# Patient Record
Sex: Male | Born: 1975 | Race: Black or African American | Hispanic: No | Marital: Single | Smoking: Never smoker
Health system: Southern US, Community
[De-identification: ages and names within clinical notes are randomized; demographics above are authoritative.]

---

## 1999-01-30 ENCOUNTER — Encounter: Payer: Self-pay | Admitting: Emergency Medicine

## 1999-01-30 ENCOUNTER — Emergency Department (HOSPITAL_COMMUNITY): Admission: EM | Admit: 1999-01-30 | Discharge: 1999-01-30 | Payer: Self-pay | Admitting: Emergency Medicine

## 1999-03-29 ENCOUNTER — Inpatient Hospital Stay (HOSPITAL_COMMUNITY): Admission: EM | Admit: 1999-03-29 | Discharge: 1999-04-04 | Payer: Self-pay | Admitting: Emergency Medicine

## 1999-03-29 ENCOUNTER — Encounter: Payer: Self-pay | Admitting: Emergency Medicine

## 1999-03-30 ENCOUNTER — Encounter: Payer: Self-pay | Admitting: Surgery

## 1999-04-01 ENCOUNTER — Encounter: Payer: Self-pay | Admitting: Gastroenterology

## 1999-04-08 ENCOUNTER — Inpatient Hospital Stay (HOSPITAL_COMMUNITY): Admission: EM | Admit: 1999-04-08 | Discharge: 1999-04-13 | Payer: Self-pay | Admitting: Emergency Medicine

## 2001-04-14 ENCOUNTER — Encounter: Payer: Self-pay | Admitting: Emergency Medicine

## 2001-04-14 ENCOUNTER — Emergency Department (HOSPITAL_COMMUNITY): Admission: EM | Admit: 2001-04-14 | Discharge: 2001-04-14 | Payer: Self-pay | Admitting: Emergency Medicine

## 2002-01-05 ENCOUNTER — Emergency Department (HOSPITAL_COMMUNITY): Admission: EM | Admit: 2002-01-05 | Discharge: 2002-01-05 | Payer: Self-pay | Admitting: Emergency Medicine

## 2002-05-15 ENCOUNTER — Emergency Department (HOSPITAL_COMMUNITY): Admission: EM | Admit: 2002-05-15 | Discharge: 2002-05-16 | Payer: Self-pay | Admitting: Emergency Medicine

## 2002-05-15 ENCOUNTER — Encounter: Payer: Self-pay | Admitting: Emergency Medicine

## 2002-05-16 ENCOUNTER — Encounter: Payer: Self-pay | Admitting: Emergency Medicine

## 2002-05-17 ENCOUNTER — Encounter
Admission: RE | Admit: 2002-05-17 | Discharge: 2002-05-17 | Payer: Self-pay | Admitting: Thoracic Surgery (Cardiothoracic Vascular Surgery)

## 2002-05-17 ENCOUNTER — Encounter: Payer: Self-pay | Admitting: Thoracic Surgery (Cardiothoracic Vascular Surgery)

## 2002-05-23 ENCOUNTER — Encounter
Admission: RE | Admit: 2002-05-23 | Discharge: 2002-05-23 | Payer: Self-pay | Admitting: Thoracic Surgery (Cardiothoracic Vascular Surgery)

## 2002-05-23 ENCOUNTER — Encounter: Payer: Self-pay | Admitting: Thoracic Surgery (Cardiothoracic Vascular Surgery)

## 2003-04-28 ENCOUNTER — Emergency Department (HOSPITAL_COMMUNITY): Admission: EM | Admit: 2003-04-28 | Discharge: 2003-04-28 | Payer: Self-pay | Admitting: Emergency Medicine

## 2003-06-07 ENCOUNTER — Emergency Department (HOSPITAL_COMMUNITY): Admission: EM | Admit: 2003-06-07 | Discharge: 2003-06-07 | Payer: Self-pay | Admitting: Emergency Medicine

## 2004-05-09 ENCOUNTER — Emergency Department (HOSPITAL_COMMUNITY): Admission: EM | Admit: 2004-05-09 | Discharge: 2004-05-09 | Payer: Self-pay | Admitting: Emergency Medicine

## 2005-01-14 ENCOUNTER — Emergency Department (HOSPITAL_COMMUNITY): Admission: EM | Admit: 2005-01-14 | Discharge: 2005-01-14 | Payer: Self-pay | Admitting: *Deleted

## 2005-11-01 ENCOUNTER — Emergency Department (HOSPITAL_COMMUNITY): Admission: EM | Admit: 2005-11-01 | Discharge: 2005-11-01 | Payer: Self-pay | Admitting: Emergency Medicine

## 2006-07-22 ENCOUNTER — Emergency Department (HOSPITAL_COMMUNITY): Admission: EM | Admit: 2006-07-22 | Discharge: 2006-07-22 | Payer: Self-pay | Admitting: *Deleted

## 2007-05-09 ENCOUNTER — Emergency Department (HOSPITAL_COMMUNITY): Admission: EM | Admit: 2007-05-09 | Discharge: 2007-05-09 | Payer: Self-pay | Admitting: Emergency Medicine

## 2007-06-11 ENCOUNTER — Emergency Department (HOSPITAL_COMMUNITY): Admission: EM | Admit: 2007-06-11 | Discharge: 2007-06-11 | Payer: Self-pay | Admitting: Emergency Medicine

## 2007-06-15 ENCOUNTER — Emergency Department (HOSPITAL_COMMUNITY): Admission: EM | Admit: 2007-06-15 | Discharge: 2007-06-15 | Payer: Self-pay | Admitting: Emergency Medicine

## 2007-07-14 ENCOUNTER — Emergency Department (HOSPITAL_COMMUNITY): Admission: EM | Admit: 2007-07-14 | Discharge: 2007-07-14 | Payer: Self-pay | Admitting: Emergency Medicine

## 2008-05-08 ENCOUNTER — Ambulatory Visit: Payer: Self-pay | Admitting: Cardiology

## 2008-05-08 ENCOUNTER — Emergency Department (HOSPITAL_COMMUNITY): Admission: EM | Admit: 2008-05-08 | Discharge: 2008-05-08 | Payer: Self-pay | Admitting: Emergency Medicine

## 2008-05-09 ENCOUNTER — Encounter: Payer: Self-pay | Admitting: Cardiology

## 2008-05-09 ENCOUNTER — Ambulatory Visit: Payer: Self-pay
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
## 2011-09-17 LAB — CBC
HCT: 46.6
MCHC: 34.1
MCV: 92.5
Platelets: 203
RDW: 13.1

## 2011-09-17 LAB — RAPID URINE DRUG SCREEN, HOSP PERFORMED
Barbiturates: NOT DETECTED
Benzodiazepines: NOT DETECTED
Cocaine: POSITIVE — AB
Opiates: NOT DETECTED

## 2011-09-17 LAB — POCT CARDIAC MARKERS: Myoglobin, poc: 59.9

## 2011-09-17 LAB — POCT I-STAT, CHEM 8
Creatinine, Ser: 1.5
HCT: 50
Hemoglobin: 17
Potassium: 4.5
Sodium: 138
TCO2: 27

## 2011-09-17 LAB — DIFFERENTIAL
Basophils Relative: 1
Eosinophils Absolute: 0.3
Eosinophils Relative: 6 — ABNORMAL HIGH
Lymphs Abs: 1.3
Neutrophils Relative %: 53

## 2011-09-17 LAB — TSH: TSH: 1.62

## 2011-09-17 LAB — URINALYSIS, ROUTINE W REFLEX MICROSCOPIC
Glucose, UA: NEGATIVE
Ketones, ur: NEGATIVE
Nitrite: NEGATIVE
Specific Gravity, Urine: 1.012
pH: 7

## 2013-09-22 ENCOUNTER — Encounter (HOSPITAL_COMMUNITY): Payer: Self-pay | Admitting: Emergency Medicine

## 2013-09-22 ENCOUNTER — Emergency Department (HOSPITAL_COMMUNITY)
Admission: EM | Admit: 2013-09-22 | Discharge: 2013-09-22 | Disposition: A | Discharge status: Home / Self Care | Payer: Self-pay | Attending: Emergency Medicine | Admitting: Emergency Medicine

## 2013-09-22 DIAGNOSIS — H5710 Ocular pain, unspecified eye: Secondary | ICD-10-CM | POA: Insufficient documentation

## 2013-09-22 DIAGNOSIS — H5789 Other specified disorders of eye and adnexa: Secondary | ICD-10-CM | POA: Insufficient documentation

## 2013-09-22 DIAGNOSIS — Z79899 Other long term (current) drug therapy: Secondary | ICD-10-CM | POA: Insufficient documentation

## 2013-09-22 DIAGNOSIS — H579 Unspecified disorder of eye and adnexa: Secondary | ICD-10-CM | POA: Insufficient documentation

## 2013-09-22 DIAGNOSIS — Z7982 Long term (current) use of aspirin: Secondary | ICD-10-CM | POA: Insufficient documentation

## 2013-09-22 MED ORDER — FLUORESCEIN SODIUM 1 MG OP STRP
1.0000 | ORAL_STRIP | Freq: Once | OPHTHALMIC | Status: DC
  Filled 2013-09-22: qty 1

## 2013-09-22 MED ORDER — KETOROLAC TROMETHAMINE 0.5 % OP SOLN
1.0000 [drp] | Freq: Four times a day (QID) | OPHTHALMIC | Status: DC
  Administered 2013-09-22: 1 [drp] via OPHTHALMIC
  Filled 2013-09-22: qty 3

## 2013-09-22 MED ORDER — TETRACAINE HCL 0.5 % OP SOLN
1.0000 [drp] | Freq: Once | OPHTHALMIC | Status: DC
  Filled 2013-09-22: qty 2

## 2013-09-22 MED ORDER — POLYMYXIN B-TRIMETHOPRIM 10000-0.1 UNIT/ML-% OP SOLN
1.0000 [drp] | OPHTHALMIC | Status: DC
  Administered 2013-09-22: 1 [drp] via OPHTHALMIC
  Filled 2013-09-22: qty 10
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
## 2013-09-22 NOTE — ED Provider Notes (Signed)
Medical screening examination/treatment/procedure(s) were performed by non-physician practitioner and as supervising physician I was immediately available for consultation/collaboration.   William Bailley Guilford, MD 09/22/13 0909 

## 2013-09-22 NOTE — ED Notes (Signed)
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  Pt reports redness, and itching to eyes since then.

## 2013-09-22 NOTE — Progress Notes (Signed)
[facility] did not get to see patient but will be sending him information about the [facility] Orange Card program, using the address provided.  °

## 2013-09-22 NOTE — ED Notes (Signed)
Fluorescein and tetracaine at bedside for PA

## 2014-02-04 ENCOUNTER — Encounter (HOSPITAL_COMMUNITY): Payer: Self-pay | Admitting: Emergency Medicine

## 2014-02-04 ENCOUNTER — Emergency Department (HOSPITAL_COMMUNITY)
Admission: EM | Admit: 2014-02-04 | Discharge: 2014-02-04 | Disposition: A | Discharge status: Home / Self Care | Payer: Self-pay | Attending: Emergency Medicine | Admitting: Emergency Medicine

## 2014-02-04 DIAGNOSIS — Z7982 Long term (current) use of aspirin: Secondary | ICD-10-CM | POA: Insufficient documentation

## 2014-02-04 DIAGNOSIS — H109 Unspecified conjunctivitis: Secondary | ICD-10-CM | POA: Insufficient documentation

## 2014-02-04 DIAGNOSIS — Z79899 Other long term (current) drug therapy: Secondary | ICD-10-CM | POA: Insufficient documentation

## 2014-02-04 MED ORDER — HYDROCODONE-ACETAMINOPHEN 5-325 MG PO TABS: 1.0000 | ORAL_TABLET | ORAL | Status: DC | PRN

## 2014-02-04 MED ORDER — TOBRAMYCIN-DEXAMETHASONE 0.3-0.1 % OP SUSP
1.0000 [drp] | Freq: Four times a day (QID) | OPHTHALMIC | Status: DC
  Administered 2014-02-04: 1 [drp] via OPHTHALMIC
  Filled 2014-02-04: qty 2.5

## 2014-02-04 MED ORDER — FLUORESCEIN SODIUM 1 MG OP STRP
2.0000 | ORAL_STRIP | Freq: Once | OPHTHALMIC | Status: AC
  Administered 2014-02-04: 2 via OPHTHALMIC

## 2014-02-04 MED ORDER — PROPARACAINE HCL 0.5 % OP SOLN
2.0000 [drp] | Freq: Once | OPHTHALMIC | Status: AC
  Administered 2014-02-04: 2 [drp] via OPHTHALMIC
  Filled 2014-02-04: qty 15

## 2014-02-04 NOTE — ED Notes (Signed)
Pharmacy contacts to send tobradex to ED tube station.

## 2014-02-04 NOTE — Discharge Instructions (Signed)
Conjunctivitis °Conjunctivitis is commonly called "pink eye." Conjunctivitis can be caused by bacterial or viral infection, allergies, or injuries. There is usually redness of the lining of the eye, itching, discomfort, and sometimes discharge. There may be deposits of matter along the eyelids. A viral infection usually causes a watery discharge, while a bacterial infection causes a yellowish, thick discharge. Pink eye is very contagious and spreads by direct contact. °You may be given antibiotic eyedrops as part of your treatment. Before using your eye medicine, remove all drainage from the eye by washing gently with warm water and cotton balls. Continue to use the medication until you have awakened 2 mornings in a row without discharge from the eye. Do not rub your eye. This increases the irritation and helps spread infection. Use separate towels from other household members. Wash your hands with soap and water before and after touching your eyes. Use cold compresses to reduce pain and sunglasses to relieve irritation from light. Do not wear contact lenses or wear eye makeup until the infection is gone. °SEEK MEDICAL CARE IF:  °· Your symptoms are not better after 3 days of treatment. °· You have increased pain or trouble seeing. °· The outer eyelids become very red or swollen. °Document Released: 01/15/2005 Document Revised: 03/01/2012 Document Reviewed: 12/08/2005 °ExitCare® Patient Information ©2014 [employer]. ° °Allergic Conjunctivitis °The conjunctiva is a thin membrane that covers the visible white part of the eyeball and the underside of the eyelids. This membrane protects and lubricates the eye. The membrane has small blood vessels running through it that can normally be seen. When the conjunctiva becomes inflamed, the condition is called conjunctivitis. In response to the inflammation, the conjunctival blood vessels become swollen. The swelling results in redness in the normally white part of the  eye. °The blood vessels of this membrane also react when a person has allergies and is then called allergic conjunctivitis. This condition usually lasts for as long as the allergy persists. Allergic conjunctivitis cannot be passed to another person (non-contagious). The likelihood of bacterial infection is great and the cause is not likely due to allergies if the inflamed eye has: °· A sticky discharge. °· Discharge or sticking together of the lids in the morning. °· Scaling or flaking of the eyelids where the eyelashes come out. °· Red swollen eyelids. °CAUSES  °· Viruses. °· Irritants such as foreign bodies. °· Chemicals. °· General allergic reactions. °· Inflammation or serious diseases in the inside or the outside of the eye or the orbit (the boney cavity in which the eye sits) can cause a "red eye." °SYMPTOMS  °· Eye redness. °· Tearing. °· Itchy eyes. °· Burning feeling in the eyes. °· Clear drainage from the eye. °· Allergic reaction due to pollens or ragweed sensitivity. Seasonal allergic conjunctivitis is frequent in the spring when pollens are in the air and in the fall. °DIAGNOSIS  °This condition, in its many forms, is usually diagnosed based on the history and an ophthalmological exam. It usually involves both eyes. If your eyes react at the same time every year, allergies may be the cause. While most "red eyes" are due to allergy or an infection, the role of an eye (ophthalmological) exam is important. The exam can rule out serious diseases of the eye or orbit. °TREATMENT  °· Non-antibiotic eye drops, ointments, or medications by mouth may be prescribed if the ophthalmologist is sure the conjunctivitis is due to allergies alone. °· Over-the-counter drops and ointments for allergic symptoms should   be used only after other causes of conjunctivitis have been ruled out, or as your caregiver suggests. °Medications by mouth are often prescribed if other allergy-related symptoms are present. If the  ophthalmologist is sure that the conjunctivitis is due to allergies alone, treatment is normally limited to drops or ointments to reduce itching and burning. °HOME CARE INSTRUCTIONS  °· Wash hands before and after applying drops or ointments, or touching the inflamed eye(s) or eyelids. °· Do not let the eye dropper tip or ointment tube touch the eyelid when putting medicine in your eye. °· Stop using your soft contact lenses and throw them away. Use a new pair of lenses when recovery is complete. You should run through sterilizing cycles at least three times before use after complete recovery if the old soft contact lenses are to be used. Hard contact lenses should be stopped. They need to be thoroughly sterilized before use after recovery. °· Itching and burning eyes due to allergies is often relieved by using a cool cloth applied to closed eye(s). °SEEK MEDICAL CARE IF:  °· Your problems do not go away after two or three days of treatment. °· Your lids are sticky (especially in the morning when you wake up) or stick together. °· Discharge develops. Antibiotics may be needed either as drops, ointment, or by mouth. °· You have extreme light sensitivity. °· An oral temperature above 102° F (38.9° C) develops. °· Pain in or around the eye or any other visual symptom develops. °MAKE SURE YOU:  °· Understand these instructions. °· Will watch your condition. °· Will get help right away if you are not doing well or get worse. °Document Released: 02/28/2003 Document Revised: 03/01/2012 Document Reviewed: 01/24/2008 °ExitCare® Patient Information ©2014 [employer]. ° °

## 2014-02-04 NOTE — ED Provider Notes (Signed)
CSN: [id number]     Arrival date & time 02/04/14  [id number] History   First MD Initiated Contact with Patient 02/04/14 [phone]     Chief Complaint  Patient presents with  . Eye Injury     (Consider location/radiation/quality/duration/timing/severity/associated sxs/prior Treatment) Patient is a [age] y.o. male presenting with eye injury. The history is provided by the patient. No language interpreter was used.  Eye Injury This is a new problem. The current episode started in the past 7 days. Pertinent negatives include no fever. 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 He now presents with painful, watering eye with mild bilateral lid swelling. No visual changes. 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 .    History reviewed. No pertinent past medical history. History reviewed. No pertinent past surgical history. No family history on file. History  Substance Use Topics  . Smoking status: Never Smoker   . Smokeless tobacco: Never Used  . Alcohol Use: Yes     Comment: occ.    Review of Systems  Constitutional: Negative for fever.  HENT: Negative for sneezing.   Eyes: Positive for pain and redness. Negative for discharge and visual disturbance.      Allergies  Review of patient's allergies indicates no known allergies.  Home Medications   Current Outpatient Rx  Name  Route  Sig  Dispense  Refill  . aspirin EC 81 MG tablet   Oral   Take 81 mg by mouth daily.         Marland Kitchen. ketotifen (ZADITOR) 0.025 % ophthalmic solution   Both Eyes   Place 1 drop into both eyes 2 (two) times daily. Itching         . naphazoline-glycerin (CLEAR EYES REDNESS RELIEF) 0.012-0.2 % SOLN   Both Eyes   Place 1-2 drops into both eyes every 4 (four) hours as needed (eye irritation).          BP 137/86  Pulse 87  Temp(Src) 97.9 F (36.6 C) (Oral)  Resp 16  SpO2 100% Physical Exam  Constitutional: He  appears well-developed and well-nourished. No distress.  Eyes: EOM are normal. Pupils are equal, round, and reactive to light.  Bilateral conjunctival redness and edema with clear drainage. Cornea clear without cloudiness or hyphema. PERRL. Lids bilaterally swollen and red.   Neck: Normal range of motion.  Pulmonary/Chest: Effort normal.    ED Course  Procedures (including critical care time) Labs Review Labs Reviewed - No data to display Imaging Review No results found.  EKG Interpretation   None       MDM   Final diagnoses:  None    1. Conjunctivitis   Proparacaine used here which gave some relief of pain. Dr. Fayrene FearingJames in to see patient and performs slit lamp examination without finding of any sign of keratitis. Diagnosis c/w allergic vs bacterial vs viral conjunctivitis. He has been using Ketotifen at home without relief. Will given Tobradex at Dr. Fayrene FearingJames' direction. Follow up with ophthalmology.     Arnoldo HookerShari A Mccabe Gloria, PA-C 02/04/14 1000  Arnoldo HookerShari A Dannette Kinkaid, PA-C 02/04/14 1033

## 2014-02-04 NOTE — ED Notes (Signed)
Pt from home c/o bilateral eye pain with swelling. 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 Pt denies changes in vision. Pt is A&O and in NAD

## 2014-02-06 NOTE — ED Provider Notes (Signed)
Medical screening examination/treatment/procedure(s) were performed by non-physician practitioner and as supervising physician I was immediately available for consultation/collaboration.  EKG Interpretation   None         Rolland PorterMark Christabell Loseke, MD 02/06/14 2251

## 2019-08-16 ENCOUNTER — Inpatient Hospital Stay (HOSPITAL_COMMUNITY)
Admission: EM | Admit: 2019-08-16 | Discharge: 2019-08-19 | DRG: 378 | Disposition: A | Discharge status: Home / Self Care | Payer: Self-pay | Attending: Internal Medicine | Admitting: Internal Medicine

## 2019-08-16 ENCOUNTER — Other Ambulatory Visit: Payer: Self-pay

## 2019-08-16 ENCOUNTER — Encounter (HOSPITAL_COMMUNITY): Payer: Self-pay

## 2019-08-16 DIAGNOSIS — Z20828 Contact with and (suspected) exposure to other viral communicable diseases: Secondary | ICD-10-CM | POA: Diagnosis present

## 2019-08-16 DIAGNOSIS — D62 Acute posthemorrhagic anemia: Secondary | ICD-10-CM | POA: Diagnosis present

## 2019-08-16 DIAGNOSIS — I951 Orthostatic hypotension: Secondary | ICD-10-CM | POA: Diagnosis present

## 2019-08-16 DIAGNOSIS — F1010 Alcohol abuse, uncomplicated: Secondary | ICD-10-CM | POA: Diagnosis present

## 2019-08-16 DIAGNOSIS — F1090 Alcohol use, unspecified, uncomplicated: Secondary | ICD-10-CM

## 2019-08-16 DIAGNOSIS — Z7982 Long term (current) use of aspirin: Secondary | ICD-10-CM

## 2019-08-16 DIAGNOSIS — K5731 Diverticulosis of large intestine without perforation or abscess with bleeding: Principal | ICD-10-CM | POA: Diagnosis present

## 2019-08-16 DIAGNOSIS — Z79899 Other long term (current) drug therapy: Secondary | ICD-10-CM

## 2019-08-16 DIAGNOSIS — Z789 Other specified health status: Secondary | ICD-10-CM

## 2019-08-16 DIAGNOSIS — R739 Hyperglycemia, unspecified: Secondary | ICD-10-CM | POA: Diagnosis present

## 2019-08-16 DIAGNOSIS — E860 Dehydration: Secondary | ICD-10-CM | POA: Diagnosis present

## 2019-08-16 DIAGNOSIS — K625 Hemorrhage of anus and rectum: Secondary | ICD-10-CM

## 2019-08-16 DIAGNOSIS — Z7289 Other problems related to lifestyle: Secondary | ICD-10-CM

## 2019-08-16 DIAGNOSIS — K922 Gastrointestinal hemorrhage, unspecified: Secondary | ICD-10-CM

## 2019-08-16 LAB — COMPREHENSIVE METABOLIC PANEL
ALT: 19 U/L (ref 0–44)
AST: 21 U/L (ref 15–41)
Albumin: 3.5 g/dL (ref 3.5–5.0)
Alkaline Phosphatase: 64 U/L (ref 38–126)
Anion gap: 9 (ref 5–15)
BUN: 17 mg/dL (ref 6–20)
CO2: 25 mmol/L (ref 22–32)
Calcium: 8.7 mg/dL — ABNORMAL LOW (ref 8.9–10.3)
Chloride: 103 mmol/L (ref 98–111)
Creatinine, Ser: 1.14 mg/dL (ref 0.61–1.24)
GFR calc Af Amer: >60 mL/min (ref >60)
GFR calc non Af Amer: >60 mL/min (ref >60)
Glucose, Bld: 150 mg/dL — ABNORMAL HIGH (ref 70–99)
Potassium: 3.9 mmol/L (ref 3.5–5.1)
Sodium: 137 mmol/L (ref 135–145)
Total Bilirubin: 0.4 mg/dL (ref 0.3–1.2)
Total Protein: 6.8 g/dL (ref 6.5–8.1)

## 2019-08-16 LAB — CBC
HCT: 35.5 % — ABNORMAL LOW (ref 39.0–52.0)
Hemoglobin: 11.2 g/dL — ABNORMAL LOW (ref 13.0–17.0)
MCH: 30.6 pg (ref 26.0–34.0)
MCHC: 31.5 g/dL (ref 30.0–36.0)
MCV: 97 fL (ref 80.0–100.0)
Platelets: 217 10*3/uL (ref 150–400)
RBC: 3.66 MIL/uL — ABNORMAL LOW (ref 4.22–5.81)
RDW: 13 % (ref 11.5–15.5)
WBC: 8.3 10*3/uL (ref 4.0–10.5)
nRBC: 0 % (ref 0.0–0.2)

## 2019-08-16 LAB — POC OCCULT BLOOD, ED: Fecal Occult Bld: POSITIVE — AB

## 2019-08-16 LAB — PROTIME-INR
INR: 1 (ref 0.8–1.2)
Prothrombin Time: 13.5 seconds (ref 11.4–15.2)

## 2019-08-16 MED ORDER — ACETAMINOPHEN 650 MG RE SUPP: 650.0000 mg | Freq: Four times a day (QID) | RECTAL | Status: DC | PRN

## 2019-08-16 MED ORDER — ACETAMINOPHEN 325 MG PO TABS
650.0000 mg | ORAL_TABLET | Freq: Four times a day (QID) | ORAL | Status: DC | PRN
  Administered 2019-08-18: 650 mg via ORAL
  Filled 2019-08-16: qty 2

## 2019-08-16 MED ORDER — SODIUM CHLORIDE 0.9 % IV BOLUS
1000.0000 mL | Freq: Once | INTRAVENOUS | Status: AC
  Administered 2019-08-16: 1000 mL via INTRAVENOUS

## 2019-08-16 MED ORDER — SODIUM CHLORIDE 0.9 % IV BOLUS
1000.0000 mL | Freq: Once | INTRAVENOUS | Status: AC
  Administered 2019-08-16: 21:00:00 1000 mL via INTRAVENOUS

## 2019-08-16 MED ORDER — SODIUM CHLORIDE 0.9% FLUSH
3.0000 mL | Freq: Two times a day (BID) | INTRAVENOUS | Status: DC
  Administered 2019-08-17 – 2019-08-18 (×3): 3 mL via INTRAVENOUS

## 2019-08-16 NOTE — ED Notes (Signed)
ED TO INPATIENT HANDOFF REPORT  Name/Age/Gender Peter Armstrong 43 y.o. male  Code Status   Home/SNF/Other Home  Chief Complaint blood in stool  Level of Care/Admitting Diagnosis ED Disposition    ED Disposition Condition [place]: [place] [100102]  Level of Care: Telemetry [5]  Admit to tele based on following criteria: Other see comments  Comments: Orthostatic hypotension  Covid Evaluation: Asymptomatic Screening Protocol (No Symptoms)  Diagnosis: Orthostatic hypotension [458.0.ICD-9-CM]  Admitting Physician: Lenore Cordia [[id number]]  Attending Physician: Lenore Cordia [[id number]]  PT Class (Do Not Modify): Observation [104]  PT Acc Code (Do Not Modify): Observation [10022]       Medical History History reviewed. No pertinent past medical history.  Allergies No Known Allergies  IV Location/Drains/Wounds Patient Lines/Drains/Airways Status   Active Line/Drains/Airways    Name:   Placement date:   Placement time:   Site:   Days:   Peripheral IV 08/16/19 Right Forearm   08/16/19    1820    Forearm   less than 1          Labs/Imaging Results for orders placed or performed during the hospital encounter of 08/16/19 (from the past 48 hour(s))  Comprehensive metabolic panel     Status: Abnormal   Collection Time: 08/16/19  6:33 PM  Result Value Ref Range   Sodium 137 135 - 145 mmol/L   Potassium 3.9 3.5 - 5.1 mmol/L   Chloride 103 98 - 111 mmol/L   CO2 25 22 - 32 mmol/L   Glucose, Bld 150 (H) 70 - 99 mg/dL   BUN 17 6 - 20 mg/dL   Creatinine, Ser 1.14 0.61 - 1.24 mg/dL   Calcium 8.7 (L) 8.9 - 10.3 mg/dL   Total Protein 6.8 6.5 - 8.1 g/dL   Albumin 3.5 3.5 - 5.0 g/dL   AST 21 15 - 41 U/L   ALT 19 0 - 44 U/L   Alkaline Phosphatase 64 38 - 126 U/L   Total Bilirubin 0.4 0.3 - 1.2 mg/dL   GFR calc non Af Amer >60 >60 mL/min   GFR calc Af Amer >60 >60 mL/min   Anion gap 9 5 - 15    Comment: Performed at [facility], [place] [address], [place], [place] [address]  CBC     Status: Abnormal   Collection Time: 08/16/19  6:33 PM  Result Value Ref Range   WBC 8.3 4.0 - 10.5 K/uL   RBC 3.66 (L) 4.22 - 5.81 MIL/uL   Hemoglobin 11.2 (L) 13.0 - 17.0 g/dL   HCT 35.5 (L) 39.0 - 52.0 %   MCV 97.0 80.0 - 100.0 fL   MCH 30.6 26.0 - 34.0 pg   MCHC 31.5 30.0 - 36.0 g/dL   RDW 13.0 11.5 - 15.5 %   Platelets 217 150 - 400 K/uL   nRBC 0.0 0.0 - 0.2 %    Comment: Performed at [facility], [place] [address], [place], [place] [address]  Type and screen [place]     Status: None   Collection Time: 08/16/19  6:33 PM  Result Value Ref Range   ABO/RH(D) A POS    Antibody Screen NEG    Sample Expiration      08/19/2019,2359 Performed at [facility], [place] [address] [place], [place] [address]   POC occult blood, ED     Status: Abnormal   Collection Time:  08/16/19  7:18 PM  Result Value Ref Range   Fecal Occult Bld POSITIVE (A) NEGATIVE  Protime-INR     Status: None   Collection Time: 08/16/19  7:55 PM  Result Value Ref Range   Prothrombin Time 13.5 11.4 - 15.2 seconds   INR 1.0 0.8 - 1.2    Comment: (NOTE) INR goal varies based on device and disease states. Performed at [facility], [address] [place], [place] [address]   ABO/Rh     Status: None (Preliminary result)   Collection Time: 08/16/19  7:59 PM  Result Value Ref Range   ABO/RH(D)      A POS Performed at [facility], [address] [place], [place] [address]    No results found.  Pending Labs Unresulted Labs (From admission, onward)    Start     Ordered   08/16/19 2034  SARS CORONAVIRUS 2 (TAT 6-12 HRS) Nasal Swab Aptima Multi Swab  (Asymptomatic/Tier 2 Patients Labs)  Once,   STAT    Question Answer Comment  Is this test for diagnosis or screening Screening   Symptomatic for COVID-19 as defined by CDC No   Hospitalized for  COVID-19 No   Admitted to ICU for COVID-19 No   Previously tested for COVID-19 No   Resident in a congregate (group) care setting Unknown   Employed in healthcare setting Unknown      08/16/19 2034          Vitals/Pain Today's Vitals   08/16/19 2015 08/16/19 2020 08/16/19 2030 08/16/19 2130  BP:  135/87 124/82 (!) 150/98  Pulse: 89 (!) 110 83 95  Resp: 15 16 13 20   Temp:      TempSrc:      SpO2: 100% 94% 100% 100%  Weight:      Height:      PainSc:        Isolation Precautions No active isolations  Medications Medications  sodium chloride 0.9 % bolus 1,000 mL (0 mLs Intravenous Stopped 08/16/19 2040)  sodium chloride 0.9 % bolus 1,000 mL (0 mLs Intravenous Stopped 08/16/19 2135)    Mobility walks

## 2019-08-16 NOTE — H&P (Signed)
History and Physical    Peter Armstrong ZOX:[id number] DOB: [birth year] DOA: 08/16/2019  PCP: Patient, No Pcp Per  Patient coming from: Home  I have personally briefly reviewed patient's old medical records [facility]  Chief Complaint: Rectal bleeding  HPI: Peter Hintaul H Taubman is a 43 y.o. male with medical history significant for remote history of colitis and alcohol use who presents to the ED for evaluation of bleeding per rectum.  Patient states he first noticed experiencing loose stools with maroon-colored blood 3 days ago.  Symptoms have been persistent and today he began to have bright red blood per rectum.  He presented to the ED for further evaluation and while here had another episode of rectal bleeding and while on the bedside commode became lightheaded and felt as if he was almost in a pass out.  He denies any melenic appearing stools.  He reports occasional stomach pain. He denies any other obvious bleeding including epistaxis, hemoptysis, hematemesis, or hematuria.  Patient reports a history of colitis since about 19-20 years ago but denies any prior bleeding episodes.  He reports taking a low-dose aspirin on occasion but not recently.  He avoids NSAIDs.  He reports alcohol use with a sixpack of beer every night.  He denies any tobacco use.  He reports occasional marijuana use and denies any IV drug or cocaine use.  He has not had a prior upper endoscopy or colonoscopy.  He reports a history of GI bleeding in his father and grandparents.   ED Course:  Initial vitals showed BP 149/102, pulse 120, RR 18, temp 98.3 Fahrenheit, SPO2 100% on room air.  Orthostatic vital signs were obtained and showed tachycardic response from lying to sitting position and 18 point drop in systolic BP from lying to standing.  Labs are notable for WBC 8.3, hemoglobin 11.2, hematocrit 35.5, MCV 97, platelets 217,000, sodium 137, potassium 3.9, bicarb 25, BUN 17, creatinine 1.14, serum glucose 158, LFTs  within normal limits.  FOBT was positive.  Rectal exam.  EP showed gross blood.  Patient was given 2 L normal saline and the hospitalist service was consulted to place in observation for evaluation of rectal bleeding.  Review of Systems: All systems reviewed and are negative except as documented in history of present illness above.   History reviewed. No pertinent past medical history.  History reviewed. No pertinent surgical history.  Social History:  reports that he has never smoked. He has never used smokeless tobacco. He reports current alcohol use. He reports current drug use. Drug: Marijuana.  No Known Allergies  Family History  Problem Relation Age of Onset  . GI Bleed Father      Prior to Admission medications   Medication Sig Start Date End Date Taking? Authorizing Provider  acetaminophen (TYLENOL) 500 MG tablet Take 500 mg by mouth every 6 (six) hours as needed for moderate pain.   Yes [provider]  aspirin EC 81 MG tablet Take 81 mg by mouth every 6 (six) hours as needed for moderate pain.   Yes [provider]  HYDROcodone-acetaminophen (NORCO/VICODIN) 5-325 MG per tablet Take 1-2 tablets by mouth every 4 (four) hours as needed. Patient not taking: Reported on 08/16/2019 02/04/14   Elpidio AnisUpstill, Shari, PA-C    Physical Exam: Vitals:   08/16/19 2015 08/16/19 2020 08/16/19 2030 08/16/19 2130  BP:  135/87 124/82 (!) 150/98  Pulse: 89 (!) 110 83 95  Resp: 15 16 13 20   Temp:  TempSrc:      SpO2: 100% 94% 100% 100%  Weight:      Height:        Constitutional: Resting in bed, NAD, calm, comfortable Eyes: PERRL, lids and conjunctivae normal ENMT: Mucous membranes are moist. Posterior pharynx clear of any exudate or lesions.Normal dentition.  Neck: normal, supple, no masses. Respiratory: clear to auscultation bilaterally, no wheezing, no crackles. Normal respiratory effort. No accessory muscle use.  Cardiovascular: Regular rate and rhythm, no  murmurs / rubs / gallops. No extremity edema. 2+ pedal pulses. Abdomen: no tenderness, no masses palpated. No hepatosplenomegaly. Bowel sounds positive.  Musculoskeletal: no clubbing / cyanosis. No joint deformity upper and lower extremities. Good ROM, no contractures. Normal muscle tone.  Skin: no rashes, lesions, ulcers. No induration Neurologic: CN 2-12 grossly intact. Sensation intact,Strength 5/5 in all 4.  Psychiatric: Normal judgment and insight. Alert and oriented x 3. Normal mood.     Labs on Admission: I have personally reviewed following labs and imaging studies  CBC: Recent Labs  Lab 08/16/19 1833  WBC 8.3  HGB 11.2*  HCT 35.5*  MCV 97.0  PLT 761   Basic Metabolic Panel: Recent Labs  Lab 08/16/19 1833  NA 137  K 3.9  CL 103  CO2 25  GLUCOSE 150*  BUN 17  CREATININE 1.14  CALCIUM 8.7*   GFR: Estimated Creatinine Clearance: 95.4 mL/min (by C-G formula based on SCr of 1.14 mg/dL). Liver Function Tests: Recent Labs  Lab 08/16/19 1833  AST 21  ALT 19  ALKPHOS 64  BILITOT 0.4  PROT 6.8  ALBUMIN 3.5   No results for input(s): LIPASE, AMYLASE in the last 168 hours. No results for input(s): AMMONIA in the last 168 hours. Coagulation Profile: Recent Labs  Lab 08/16/19 1955  INR 1.0   Cardiac Enzymes: No results for input(s): CKTOTAL, CKMB, CKMBINDEX, TROPONINI in the last 168 hours. BNP (last 3 results) No results for input(s): PROBNP in the last 8760 hours. HbA1C: No results for input(s): HGBA1C in the last 72 hours. CBG: No results for input(s): GLUCAP in the last 168 hours. Lipid Profile: No results for input(s): CHOL, HDL, LDLCALC, TRIG, CHOLHDL, LDLDIRECT in the last 72 hours. Thyroid Function Tests: No results for input(s): TSH, T4TOTAL, FREET4, T3FREE, THYROIDAB in the last 72 hours. Anemia Panel: No results for input(s): VITAMINB12, FOLATE, FERRITIN, TIBC, IRON, RETICCTPCT in the last 72 hours. Urine analysis:    Component Value  Date/Time   COLORURINE YELLOW 05/08/2008 0915   APPEARANCEUR CLEAR 05/08/2008 0915   LABSPEC 1.012 05/08/2008 0915   PHURINE 7.0 05/08/2008 0915   GLUCOSEU NEGATIVE 05/08/2008 0915   HGBUR NEGATIVE 05/08/2008 0915   BILIRUBINUR NEGATIVE 05/08/2008 0915   KETONESUR NEGATIVE 05/08/2008 0915   PROTEINUR NEGATIVE 05/08/2008 0915   UROBILINOGEN 0.2 05/08/2008 0915   NITRITE NEGATIVE 05/08/2008 0915   LEUKOCYTESUR  05/08/2008 0915    NEGATIVE MICROSCOPIC NOT DONE ON URINES WITH NEGATIVE PROTEIN, BLOOD, LEUKOCYTES, NITRITE, OR GLUCOSE <1000 mg/dL.    Radiological Exams on Admission: No results found.  EKG: Not performed.  Assessment/Plan Principal Problem:   Painless rectal bleeding Active Problems:   Alcohol use  DMITRI PETTIGREW is a 43 y.o. male with medical history significant for remote history of colitis and alcohol use who is admitted with painless rectal bleeding.  Painless rectal bleeding with mild anemia: Had another episode BRBPR in the ED with reported near syncopal episode.  Is also symptomatic with tachycardia and borderline orthostasis.  Reported  symptoms consistent with lower GI bleeding possibly from diverticulosis versus internal hemorrhoids. -Observe overnight and monitor for further signs/symptoms of bleeding -Repeat CBC in a.m. and transfuse as needed -Give additional 1 L normal saline fluid bolus -Hold pharmacologic VTE prophylaxis -If remains stable overnight may be able to DC in a.m. with outpatient GI follow-up  Alcohol use: Reports drinking a 6 pack of beer nightly with last drink 2 days ago.  Denies any history of alcohol withdrawal.  Patient counseled against excess alcohol use.  DVT prophylaxis: SCDs Code Status: Full code, confirmed with patient Family Communication: Discussed with patient, he has discussed with family Disposition Plan: Likely discharge home in 1-2 days Consults called: None Admission status: Observation   Darreld Mclean MD [facility]  If 7PM-7AM, please contact night-coverage [email or web address]  08/16/2019, 10:00 PM

## 2019-08-16 NOTE — ED Provider Notes (Signed)
[facility] Emergency Department Provider Note MRN:  [id number]  Arrival date & time: 08/16/19     Chief Complaint   Rectal Bleeding and Fatigue   History of Present Illness   Peter Armstrong is a 43 y.o. year-old male with no pertinent past medical history presenting to the ED with chief complaint of rectal bleeding.  2 to 3 days of rectal bleeding.  Painless, denies abdominal pain, no rectal pain.  Feeling lightheaded, dehydrated today.  Denies chest pain or shortness of breath, no fever.  Symptoms constant, no exacerbating or relieving factors.  Review of Systems  A complete 10 system review of systems was obtained and all systems are negative except as noted in the HPI and PMH.   Patient's Health History   History reviewed. No pertinent past medical history.  History reviewed. No pertinent surgical history.  History reviewed. No pertinent family history.  Social History   Socioeconomic History  . Marital status: Single    Spouse name: Not on file  . Number of children: Not on file  . Years of education: Not on file  . Highest education level: Not on file  Occupational History  . Not on file  Social Needs  . Financial resource strain: Not on file  . Food insecurity    Worry: Not on file    Inability: Not on file  . Transportation needs    Medical: Not on file    Non-medical: Not on file  Tobacco Use  . Smoking status: Never Smoker  . Smokeless tobacco: Never Used  Substance and Sexual Activity  . Alcohol use: Yes    Comment: occ.  . Drug use: No  . Sexual activity: Never  Lifestyle  . Physical activity    Days per week: Not on file    Minutes per session: Not on file  . Stress: Not on file  Relationships  . Social Herbalist on phone: Not on file    Gets together: Not on file    Attends religious service: Not on file    Active member of club or organization: Not on file    Attends meetings of clubs or organizations: Not on  file    Relationship status: Not on file  . Intimate partner violence    Fear of current or ex partner: Not on file    Emotionally abused: Not on file    Physically abused: Not on file    Forced sexual activity: Not on file  Other Topics Concern  . Not on file  Social History Narrative  . Not on file     Physical Exam  Vital Signs and Nursing Notes reviewed Vitals:   08/16/19 1754 08/16/19 2020  BP: (!) 149/102 135/87  Pulse: (!) 120 (!) 110  Resp: 18 16  Temp: 98.3 F (36.8 C)   SpO2: 100% 94%    CONSTITUTIONAL: Well-appearing, NAD NEURO:  Alert and oriented x 3, no focal deficits EYES:  eyes equal and reactive ENT/NECK:  no LAD, no JVD CARDIO: Tachycardic rate, well-perfused, normal S1 and S2 PULM:  CTAB no wheezing or rhonchi GI/GU:  normal bowel sounds, non-distended, non-tender; normal rectal tone, gross blood on rectal exam MSK/SPINE:  No gross deformities, no edema SKIN:  no rash, atraumatic PSYCH:  Appropriate speech and behavior  Diagnostic and Interventional Summary    Labs Reviewed  COMPREHENSIVE METABOLIC PANEL - Abnormal; Notable for the following components:      Result Value  Glucose, Bld 150 (*)    Calcium 8.7 (*)    All other components within normal limits  CBC - Abnormal; Notable for the following components:   RBC 3.66 (*)    Hemoglobin 11.2 (*)    HCT 35.5 (*)    All other components within normal limits  POC OCCULT BLOOD, ED - Abnormal; Notable for the following components:   Fecal Occult Bld POSITIVE (*)    All other components within normal limits  SARS CORONAVIRUS 2 (TAT 6-12 HRS)  PROTIME-INR  TYPE AND SCREEN    No orders to display    Medications  sodium chloride 0.9 % bolus 1,000 mL (has no administration in time range)  sodium chloride 0.9 % bolus 1,000 mL (1,000 mLs Intravenous New Bag/Given 08/16/19 1843)     Procedures Critical Care  ED Course and Medical Decision Making  I have reviewed the triage vital signs and the  nursing notes.  Pertinent labs & imaging results that were available during my care of the patient were reviewed by me and considered in my medical decision making (see below for details).  Concern for lower GI bleed in this otherwise healthy 43 year old male.  Possibly diverticulosis given the lack of pain.  Heart rate 120 on arrival, improving slowly with fluids.  H&H demonstrating mild anemia but significantly lower than his prior value on record.  Will request admission.  Currently without indication for advanced imaging.  Elmer Sow M. Pilar PlateBero, MD [name] Emergency Medicine [facility] [email or web address]  Final Clinical Impressions(s) / ED Diagnoses     ICD-10-CM   1. Lower GI bleed  K92.2     ED Discharge Orders    None      Discharge Instructions Discussed with and Provided to Patient: Discharge Instructions   None       Sabas SousBero,  M, MD 08/16/19 2038

## 2019-08-16 NOTE — ED Triage Notes (Signed)
Patient c/o blood in stools that started yesterday.   Patient had 3 occurences of blood in stool.    A/ox4 Ambulatory in triage.

## 2019-08-17 DIAGNOSIS — K922 Gastrointestinal hemorrhage, unspecified: Secondary | ICD-10-CM

## 2019-08-17 DIAGNOSIS — I951 Orthostatic hypotension: Secondary | ICD-10-CM | POA: Diagnosis present

## 2019-08-17 DIAGNOSIS — Z7289 Other problems related to lifestyle: Secondary | ICD-10-CM

## 2019-08-17 LAB — CBC
HCT: 22.5 % — ABNORMAL LOW (ref 39.0–52.0)
Hemoglobin: 7 g/dL — ABNORMAL LOW (ref 13.0–17.0)
MCH: 30.6 pg (ref 26.0–34.0)
MCHC: 31.1 g/dL (ref 30.0–36.0)
MCV: 98.3 fL (ref 80.0–100.0)
Platelets: 152 10*3/uL (ref 150–400)
RBC: 2.29 MIL/uL — ABNORMAL LOW (ref 4.22–5.81)
RDW: 13.1 % (ref 11.5–15.5)
WBC: 8.8 10*3/uL (ref 4.0–10.5)
nRBC: 0 % (ref 0.0–0.2)

## 2019-08-17 LAB — BASIC METABOLIC PANEL
Anion gap: 3 — ABNORMAL LOW (ref 5–15)
BUN: 16 mg/dL (ref 6–20)
CO2: 26 mmol/L (ref 22–32)
Calcium: 7.4 mg/dL — ABNORMAL LOW (ref 8.9–10.3)
Chloride: 110 mmol/L (ref 98–111)
Creatinine, Ser: 0.88 mg/dL (ref 0.61–1.24)
GFR calc Af Amer: >60 mL/min (ref >60)
GFR calc non Af Amer: >60 mL/min (ref >60)
Glucose, Bld: 123 mg/dL — ABNORMAL HIGH (ref 70–99)
Potassium: 4.4 mmol/L (ref 3.5–5.1)
Sodium: 139 mmol/L (ref 135–145)

## 2019-08-17 LAB — FIBRINOGEN: Fibrinogen: 267 mg/dL (ref 210–475)

## 2019-08-17 LAB — LACTIC ACID, PLASMA
Lactic Acid, Venous: 1.1 mmol/L (ref 0.5–1.9)
Lactic Acid, Venous: 1.4 mmol/L (ref 0.5–1.9)

## 2019-08-17 LAB — HIV ANTIBODY (ROUTINE TESTING W REFLEX): HIV Screen 4th Generation wRfx: NONREACTIVE

## 2019-08-17 LAB — PROTIME-INR
INR: 1.2 (ref 0.8–1.2)
Prothrombin Time: 14.8 seconds (ref 11.4–15.2)

## 2019-08-17 LAB — ABO/RH: ABO/RH(D): A POS

## 2019-08-17 LAB — SARS CORONAVIRUS 2 (TAT 6-24 HRS): SARS Coronavirus 2: NEGATIVE

## 2019-08-17 LAB — HEMOGLOBIN AND HEMATOCRIT, BLOOD
HCT: 30.3 % — ABNORMAL LOW (ref 39.0–52.0)
Hemoglobin: 10 g/dL — ABNORMAL LOW (ref 13.0–17.0)

## 2019-08-17 LAB — PREPARE RBC (CROSSMATCH)

## 2019-08-17 MED ORDER — LORAZEPAM 1 MG PO TABS
0.0000 mg | ORAL_TABLET | Freq: Four times a day (QID) | ORAL | Status: DC
  Administered 2019-08-18 – 2019-08-19 (×2): 1 mg via ORAL
  Filled 2019-08-17: qty 1

## 2019-08-17 MED ORDER — FOLIC ACID 1 MG PO TABS
1.0000 mg | ORAL_TABLET | Freq: Every day | ORAL | Status: DC
  Administered 2019-08-17 – 2019-08-19 (×2): 1 mg via ORAL
  Filled 2019-08-17 (×2): qty 1

## 2019-08-17 MED ORDER — VITAMIN B-1 100 MG PO TABS
100.0000 mg | ORAL_TABLET | Freq: Every day | ORAL | Status: DC
  Administered 2019-08-17: 100 mg via ORAL
  Filled 2019-08-17 (×2): qty 1

## 2019-08-17 MED ORDER — THIAMINE HCL 100 MG/ML IJ SOLN
100.0000 mg | Freq: Every day | INTRAMUSCULAR | Status: DC
  Filled 2019-08-17: qty 2

## 2019-08-17 MED ORDER — SODIUM CHLORIDE 0.9% IV SOLUTION: Freq: Once | INTRAVENOUS | Status: DC

## 2019-08-17 MED ORDER — ADULT MULTIVITAMIN W/MINERALS CH
1.0000 | ORAL_TABLET | Freq: Every day | ORAL | Status: DC
  Administered 2019-08-17 – 2019-08-19 (×2): 1 via ORAL
  Filled 2019-08-17 (×2): qty 1

## 2019-08-17 MED ORDER — LORAZEPAM 1 MG PO TABS: 0.0000 mg | ORAL_TABLET | Freq: Two times a day (BID) | ORAL | Status: DC

## 2019-08-17 MED ORDER — PEG 3350-KCL-NA BICARB-NACL 420 G PO SOLR
4000.0000 mL | Freq: Once | ORAL | Status: AC
  Administered 2019-08-17: 18:00:00 4000 mL via ORAL

## 2019-08-17 MED ORDER — LORAZEPAM 2 MG/ML IJ SOLN: 1.0000 mg | Freq: Four times a day (QID) | INTRAMUSCULAR | Status: DC | PRN

## 2019-08-17 MED ORDER — LORAZEPAM 1 MG PO TABS
1.0000 mg | ORAL_TABLET | Freq: Four times a day (QID) | ORAL | Status: DC | PRN
  Filled 2019-08-17: qty 1

## 2019-08-17 MED ORDER — FUROSEMIDE 10 MG/ML IJ SOLN
40.0000 mg | Freq: Once | INTRAMUSCULAR | Status: AC
  Administered 2019-08-17: 40 mg via INTRAVENOUS
  Filled 2019-08-17: qty 4

## 2019-08-17 MED ORDER — HYDROMORPHONE HCL 1 MG/ML IJ SOLN
0.2000 mg | Freq: Once | INTRAMUSCULAR | Status: AC
  Administered 2019-08-17: 03:00:00 0.2 mg via INTRAVENOUS
  Filled 2019-08-17: qty 0.5

## 2019-08-17 NOTE — Consult Note (Addendum)
UNASSIGNED PATIENT Reason for Consult: Rectal bleeding. Referring Physician: [facility].  Chuck Hintaul H Semple is an 43 y.o. male.  HPI: Mr. Kathrine Haddockaul Banks is a 43 year old black male who was admitted to the hospital through the emergency room with a 3-day history of rectal bleeding.  He claims he has had small volume bleeding for several years which he feels has been from his hemorrhoids however recently the bleeding became more brisk prompting him to come to the emergency room he denies having any abdominal pain nausea vomiting fever chills or rigors.  Denies a history of reflux dysphagia or odynophagia.  He had a near syncopal event in the emergency room but denies any syncope there is no history of melena occasionally has some left lower quadrant cramping when he has passed a large amount of blood.  There is a questionable history of colitis at the age of 43 but he has not had a follow-up from a medical standpoint since then he takes a low-dose aspirin occasionally but not on a regular basis he reports drinking a sixpack of beer 2-3 times a week but denies the use of tobacco if he uses marijuana occasionally but denies recreational IV drugs he claims his father and grandfather also had GI bleeding but does not know what their diagnosis was; he claims with no diagnosis was ever made.  Denies the use of nonsteroidals.  History reviewed. No pertinent past medical history.  History reviewed. No pertinent surgical history.  Family History  Problem Relation Age of Onset  . GI Bleed Father     Social History:  reports that he has never smoked. He has never used smokeless tobacco. He reports current alcohol use. He reports current drug use. Drug: Marijuana.  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  Allergies: No Known Allergies  Medications: I have reviewed the patient's current medications.  Results for orders placed or performed during the hospital encounter of 08/16/19 (from the past  48 hour(s))  Comprehensive metabolic panel     Status: Abnormal   Collection Time: 08/16/19  6:33 PM  Result Value Ref Range   Sodium 137 135 - 145 mmol/L   Potassium 3.9 3.5 - 5.1 mmol/L   Chloride 103 98 - 111 mmol/L   CO2 25 22 - 32 mmol/L   Glucose, Bld 150 (H) 70 - 99 mg/dL   BUN 17 6 - 20 mg/dL   Creatinine, Ser 9.601.14 0.61 - 1.24 mg/dL   Calcium 8.7 (L) 8.9 - 10.3 mg/dL   Total Protein 6.8 6.5 - 8.1 g/dL   Albumin 3.5 3.5 - 5.0 g/dL   AST 21 15 - 41 U/L   ALT 19 0 - 44 U/L   Alkaline Phosphatase 64 38 - 126 U/L   Total Bilirubin 0.4 0.3 - 1.2 mg/dL   GFR calc non Af Amer >60 >60 mL/min   GFR calc Af Amer >60 >60 mL/min   Anion gap 9 5 - 15    Comment: Performed at [facility], [address] [place], [place] [address]  CBC     Status: Abnormal   Collection Time: 08/16/19  6:33 PM  Result Value Ref Range   WBC 8.3 4.0 - 10.5 K/uL   RBC 3.66 (L) 4.22 - 5.81 MIL/uL   Hemoglobin 11.2 (L) 13.0 - 17.0 g/dL   HCT 81.135.5 (L) 91.439.0 - 78.252.0 %   MCV 97.0 80.0 - 100.0 fL   MCH 30.6 26.0 - 34.0 pg  MCHC 31.5 30.0 - 36.0 g/dL   RDW 19.113.0 47.811.5 - 29.515.5 %   Platelets 217 150 - 400 K/uL   nRBC 0.0 0.0 - 0.2 %    Comment: Performed at [facility], [address] [place], [place] [address]  Type and screen [facility]     Status: None (Preliminary result)   Collection Time: 08/16/19  6:33 PM  Result Value Ref Range   ABO/RH(D) A POS    Antibody Screen NEG    Sample Expiration 08/19/2019,2359    Unit Number [id number]    Blood Component Type RED CELLS,LR    Unit division 00    Status of Unit ALLOCATED    Transfusion Status OK TO TRANSFUSE    Crossmatch Result Compatible    Unit Number [id number]    Blood Component Type RBC LR PHER2    Unit division 00    Status of Unit ISSUED    Transfusion Status OK TO TRANSFUSE    Crossmatch Result      Compatible Performed at [facility], [address]  [place], [place] [address]   POC occult blood, ED     Status: Abnormal   Collection Time: 08/16/19  7:18 PM  Result Value Ref Range   Fecal Occult Bld POSITIVE (A) NEGATIVE  Protime-INR     Status: None   Collection Time: 08/16/19  7:55 PM  Result Value Ref Range   Prothrombin Time 13.5 11.4 - 15.2 seconds   INR 1.0 0.8 - 1.2    Comment: (NOTE) INR goal varies based on device and disease states. Performed at [facility], [address] [place], [place] [address]   ABO/Rh     Status: None   Collection Time: 08/16/19  7:59 PM  Result Value Ref Range   ABO/RH(D)      A POS Performed at [facility], [address] [place], [place] [address]   SARS CORONAVIRUS 2 (TAT 6-12 HRS) Nasal Swab Aptima Multi Swab     Status: None   Collection Time: 08/16/19  8:40 PM   Specimen: Aptima Multi Swab; Nasal Swab  Result Value Ref Range   SARS Coronavirus 2 NEGATIVE NEGATIVE    Comment: (NOTE) SARS-CoV-2 target nucleic acids are NOT DETECTED. The SARS-CoV-2 RNA is generally detectable in upper and lower respiratory specimens during the acute phase of infection. Negative results do not preclude SARS-CoV-2 infection, do not rule out co-infections with other pathogens, and should not be used as the sole basis for treatment or other patient management decisions. Negative results must be combined with clinical observations, patient history, and epidemiological information. The expected result is Negative. Fact Sheet for Patients: HairSlick.nohttps://www.fda.gov/media/138098/download Fact Sheet for Healthcare Providers: quierodirigir.comhttps://www.fda.gov/media/138095/download This test is not yet approved or cleared by the [place] FDA and  has been authorized for detection and/or diagnosis of SARS-CoV-2 by FDA under an Emergency Use Authorization (EUA). This EUA will remain  in effect (meaning this test can be used) for the duration of the COVID-19 declaration under Section  56 4(b)(1) of the Act, 21 U.S.C. section 360bbb-3(b)(1), unless the authorization is terminated or revoked sooner. Performed at [facility], [address] [place], [place] [address]   CBC     Status: Abnormal   Collection Time: 08/17/19  4:41 AM  Result Value Ref Range   WBC 8.8 4.0 - 10.5 K/uL   RBC 2.29 (L) 4.22 - 5.81 MIL/uL  Hemoglobin 7.0 (L) 13.0 - 17.0 g/dL    Comment: REPEATED TO VERIFY DELTA CHECK NOTED    HCT 22.5 (L) 39.0 - 52.0 %   MCV 98.3 80.0 - 100.0 fL   MCH 30.6 26.0 - 34.0 pg   MCHC 31.1 30.0 - 36.0 g/dL   RDW 75.1 02.5 - 85.2 %   Platelets 152 150 - 400 K/uL   nRBC 0.0 0.0 - 0.2 %    Comment: Performed at [facility], [address], [place], [place] [address]  Basic metabolic panel     Status: Abnormal   Collection Time: 08/17/19  4:41 AM  Result Value Ref Range   Sodium 139 135 - 145 mmol/L   Potassium 4.4 3.5 - 5.1 mmol/L   Chloride 110 98 - 111 mmol/L   CO2 26 22 - 32 mmol/L   Glucose, Bld 123 (H) 70 - 99 mg/dL   BUN 16 6 - 20 mg/dL   Creatinine, Ser 2.35 0.61 - 1.24 mg/dL   Calcium 7.4 (L) 8.9 - 10.3 mg/dL   GFR calc non Af Amer >60 >60 mL/min   GFR calc Af Amer >60 >60 mL/min   Anion gap 3 (L) 5 - 15    Comment: Performed at [facility], [address], [place], [place] [address]  Prepare RBC     Status: None   Collection Time: 08/17/19  5:22 AM  Result Value Ref Range   Order Confirmation      ORDER PROCESSED BY BLOOD BANK Performed at [facility], [address], [place], [place] [address]   Fibrinogen     Status: None   Collection Time: 08/17/19  6:17 AM  Result Value Ref Range   Fibrinogen 267 210 - 475 mg/dL    Comment: Performed at [facility], [address], [place], [place] [address]  Protime-INR     Status: None   Collection Time: 08/17/19  6:17 AM  Result Value Ref Range   Prothrombin Time 14.8 11.4 - 15.2 seconds   INR 1.2 0.8 - 1.2     Comment: (NOTE) INR goal varies based on device and disease states. Performed at [facility], [address] [place], [place] [address]   Lactic acid, plasma     Status: None   Collection Time: 08/17/19  6:17 AM  Result Value Ref Range   Lactic Acid, Venous 1.1 0.5 - 1.9 mmol/L    Comment: Performed at [facility], [address], [place], [place] [address]   No results found.  Review of Systems  Constitutional: Negative.   HENT: Negative.   Eyes: Negative.   Respiratory: Negative.   Cardiovascular: Negative.   Gastrointestinal: Positive for blood in stool. Negative for abdominal pain, heartburn, melena, nausea and vomiting.  Genitourinary: Negative.   Musculoskeletal: Negative.   Skin: Negative.   Neurological: Negative.   Endo/Heme/Allergies: Negative.   Psychiatric/Behavioral: Positive for substance abuse. Negative for depression, hallucinations, memory loss and suicidal ideas. The patient is not nervous/anxious and does not have insomnia.    Blood pressure 112/78, pulse 100, temperature 98.3 F (36.8 C), temperature source Oral, resp. rate 20, height 6\' 3"  (1.905 m), weight 80.7 kg, SpO2 100 %. Physical Exam  Constitutional: He is oriented to person, place, and time. He appears well-developed and well-nourished.  HENT:  Head: Normocephalic and atraumatic.  Eyes: Pupils are equal, round, and reactive to light. Conjunctivae and EOM are normal.  Neck: Normal range of motion. Neck supple.  Cardiovascular: Normal rate and regular rhythm.  Respiratory: Effort normal and breath sounds normal.  GI: Soft. Bowel sounds are normal. He exhibits no distension and no mass. There is no abdominal tenderness. There is no rebound and no guarding.  Musculoskeletal: Normal range of motion.  Neurological: He is alert and oriented to person, place, and time.  Skin: Skin is warm and dry.  Psychiatric: He has a normal mood and affect. His behavior is  normal. Judgment and thought content normal.   Assessment/Plan: 1) Rectal bleeding with mild anemia-proceed with a colonoscopy tomorrow further recommendations made thereafter. 2) Hyperglycemia rule out diabetes-he may benefit from a fasting Hb A1c.  Juanita Craver 08/17/2019, 9:46 AM

## 2019-08-17 NOTE — Progress Notes (Signed)
PROGRESS NOTE    COURT GRACIA  RJJ:[id number] DOB: June 09, 1976 DOA: 08/16/2019 PCP: Patient, No Pcp Per    Brief Narrative:  43 y.o. male with medical history significant for remote history of colitis and alcohol use who presents to the ED for evaluation of bleeding per rectum.  Patient states he first noticed experiencing loose stools with maroon-colored blood 3 days ago.  Symptoms have been persistent and today he began to have bright red blood per rectum.  He presented to the ED for further evaluation and while here had another episode of rectal bleeding and while on the bedside commode became lightheaded and felt as if he was almost in a pass out.  He denies any melenic appearing stools.  He reports occasional stomach pain. He denies any other obvious bleeding including epistaxis, hemoptysis, hematemesis, or hematuria.  Patient reports a history of colitis since about 19-20 years ago but denies any prior bleeding episodes.  He reports taking a low-dose aspirin on occasion but not recently.  He avoids NSAIDs.  He reports alcohol use with a sixpack of beer every night.  He denies any tobacco use.  He reports occasional marijuana use and denies any IV drug or cocaine use.  He has not had a prior upper endoscopy or colonoscopy.  He reports a history of GI bleeding in his father and grandparents.   ED Course:  Initial vitals showed BP 149/102, pulse 120, RR 18, temp 98.3 Fahrenheit, SPO2 100% on room air.  Orthostatic vital signs were obtained and showed tachycardic response from lying to sitting position and 18 point drop in systolic BP from lying to standing.  Labs are notable for WBC 8.3, hemoglobin 11.2, hematocrit 35.5, MCV 97, platelets 217,000, sodium 137, potassium 3.9, bicarb 25, BUN 17, creatinine 1.14, serum glucose 158, LFTs within normal limits.  FOBT was positive.  Rectal exam.  EP showed gross blood.  Patient was given 2 L normal saline and the hospitalist service was consulted  to place in observation for evaluation of rectal bleeding  Assessment & Plan:   Principal Problem:   Painless rectal bleeding Active Problems:   Alcohol use  Painless rectal bleeding with mild anemia: -Large BRBPR noted overnight in ED with near syncopal episode -Hgb down to 7.0 from 11.2 overnight, currently receiving 2 units PRBC's -Denies abd pain, no nausea -No prior endoscopies -Have consulted GI, currently NPO  Alcohol use: -Pt preported drinking a 6 pack of beer nightly with last drink 2 days ago.   -cessation was done at time of presentation -Will order CIWA  DVT prophylaxis: SCD's Code Status: Full Family Communication: Pt in room, family not at bedside Disposition Plan: Uncertain at this time  Consultants:   GI  Procedures:     Antimicrobials: Anti-infectives (From admission, onward)   None       Subjective: Denies abd pain, no nausea.  Objective: Vitals:   08/17/19 0959 08/17/19 1038 08/17/19 1323 08/17/19 1446  BP: 131/72 140/89 136/78 (!) 130/100  Pulse: 91 95 83 89  Resp: 16 16 14 18   Temp: 98.4 F (36.9 C)   98.4 F (36.9 C)  TempSrc: Oral Oral Oral Oral  SpO2: 100% 100% 100% 100%  Weight:      Height:        Intake/Output Summary (Last 24 hours) at 08/17/2019 1528 Last data filed at 08/17/2019 1303 Gross per 24 hour  Intake 2736 ml  Output -  Net 2736 ml   [employer]  08/16/19 1931  Weight: 80.7 kg    Examination:  General exam: Appears calm and comfortable  Respiratory system: Clear to auscultation. Respiratory effort normal. Cardiovascular system: S1 & S2 heard, RRR Gastrointestinal system: Abdomen is nondistended, soft and nontender. No organomegaly or masses felt. Normal bowel sounds heard. Central nervous system: Alert and oriented. No focal neurological deficits. Extremities: Symmetric 5 x 5 power. Skin: No rashes, lesions Psychiatry: Judgement and insight appear normal. Mood & affect appropriate.   Data  Reviewed: I have personally reviewed following labs and imaging studies  CBC: Recent Labs  Lab 08/16/19 1833 08/17/19 0441  WBC 8.3 8.8  HGB 11.2* 7.0*  HCT 35.5* 22.5*  MCV 97.0 98.3  PLT 217 152   Basic Metabolic Panel: Recent Labs  Lab 08/16/19 1833 08/17/19 0441  NA 137 139  K 3.9 4.4  CL 103 110  CO2 25 26  GLUCOSE 150* 123*  BUN 17 16  CREATININE 1.14 0.88  CALCIUM 8.7* 7.4*   GFR: Estimated Creatinine Clearance: 123.5 mL/min (by C-G formula based on SCr of 0.88 mg/dL). Liver Function Tests: Recent Labs  Lab 08/16/19 1833  AST 21  ALT 19  ALKPHOS 64  BILITOT 0.4  PROT 6.8  ALBUMIN 3.5   No results for input(s): LIPASE, AMYLASE in the last 168 hours. No results for input(s): AMMONIA in the last 168 hours. Coagulation Profile: Recent Labs  Lab 08/16/19 1955 08/17/19 0617  INR 1.0 1.2   Cardiac Enzymes: No results for input(s): CKTOTAL, CKMB, CKMBINDEX, TROPONINI in the last 168 hours. BNP (last 3 results) No results for input(s): PROBNP in the last 8760 hours. HbA1C: No results for input(s): HGBA1C in the last 72 hours. CBG: No results for input(s): GLUCAP in the last 168 hours. Lipid Profile: No results for input(s): CHOL, HDL, LDLCALC, TRIG, CHOLHDL, LDLDIRECT in the last 72 hours. Thyroid Function Tests: No results for input(s): TSH, T4TOTAL, FREET4, T3FREE, THYROIDAB in the last 72 hours. Anemia Panel: No results for input(s): VITAMINB12, FOLATE, FERRITIN, TIBC, IRON, RETICCTPCT in the last 72 hours. Sepsis Labs: Recent Labs  Lab 08/17/19 0617  LATICACIDVEN 1.1    Recent Results (from the past 240 hour(s))  SARS CORONAVIRUS 2 (TAT 6-12 HRS) Nasal Swab Aptima Multi Swab     Status: None   Collection Time: 08/16/19  8:40 PM   Specimen: Aptima Multi Swab; Nasal Swab  Result Value Ref Range Status   SARS Coronavirus 2 NEGATIVE NEGATIVE Final    Comment: (NOTE) SARS-CoV-2 target nucleic acids are NOT DETECTED. The SARS-CoV-2 RNA is  generally detectable in upper and lower respiratory specimens during the acute phase of infection. Negative results do not preclude SARS-CoV-2 infection, do not rule out co-infections with other pathogens, and should not be used as the sole basis for treatment or other patient management decisions. Negative results must be combined with clinical observations, patient history, and epidemiological information. The expected result is Negative. Fact Sheet for Patients: HairSlick.no Fact Sheet for Healthcare Providers: quierodirigir.com This test is not yet approved or cleared by the [place] FDA and  has been authorized for detection and/or diagnosis of SARS-CoV-2 by FDA under an Emergency Use Authorization (EUA). This EUA will remain  in effect (meaning this test can be used) for the duration of the COVID-19 declaration under Section 56 4(b)(1) of the Act, 21 U.S.C. section 360bbb-3(b)(1), unless the authorization is terminated or revoked sooner. Performed at [facility], [address] [place], [place] [address]  Radiology Studies: No results found.  Scheduled Meds: . sodium chloride   Intravenous Once  . sodium chloride flush  3 mL Intravenous Q12H   Continuous Infusions:   LOS: 0 days   Rickey BarbaraStephen Kerrilynn Derenzo, MD [facility] Pager On Amion  If 7PM-7AM, please contact night-coverage 08/17/2019, 3:28 PM

## 2019-08-18 ENCOUNTER — Encounter (HOSPITAL_COMMUNITY)
Admission: EM | Disposition: A | Discharge status: Home / Self Care | Payer: Self-pay | Source: Home / Self Care | Attending: Internal Medicine

## 2019-08-18 ENCOUNTER — Encounter (HOSPITAL_COMMUNITY): Payer: Self-pay

## 2019-08-18 DIAGNOSIS — I951 Orthostatic hypotension: Secondary | ICD-10-CM

## 2019-08-18 HISTORY — PX: COLONOSCOPY: SHX5424

## 2019-08-18 LAB — TYPE AND SCREEN
ABO/RH(D): A POS
Antibody Screen: NEGATIVE
Unit division: 0
Unit division: 0

## 2019-08-18 LAB — BPAM RBC
Blood Product Expiration Date: 202009182359
Blood Product Expiration Date: 202009202359
ISSUE DATE / TIME: 202008260620
ISSUE DATE / TIME: 202008261016
Unit Type and Rh: 6200
Unit Type and Rh: 6200

## 2019-08-18 LAB — CBC
HCT: 25.3 % — ABNORMAL LOW (ref 39.0–52.0)
Hemoglobin: 8.2 g/dL — ABNORMAL LOW (ref 13.0–17.0)
MCH: 30.9 pg (ref 26.0–34.0)
MCHC: 32.4 g/dL (ref 30.0–36.0)
MCV: 95.5 fL (ref 80.0–100.0)
Platelets: 137 10*3/uL — ABNORMAL LOW (ref 150–400)
RBC: 2.65 MIL/uL — ABNORMAL LOW (ref 4.22–5.81)
RDW: 13.5 % (ref 11.5–15.5)
WBC: 8.5 10*3/uL (ref 4.0–10.5)
nRBC: 0 % (ref 0.0–0.2)

## 2019-08-18 SURGERY — COLONOSCOPY
Anesthesia: Moderate Sedation | Laterality: Left

## 2019-08-18 MED ORDER — FENTANYL CITRATE (PF) 100 MCG/2ML IJ SOLN
INTRAMUSCULAR | Status: DC | PRN
  Administered 2019-08-18 (×4): 25 ug via INTRAVENOUS

## 2019-08-18 MED ORDER — SODIUM CHLORIDE 0.9 % IV SOLN
INTRAVENOUS | Status: DC
  Administered 2019-08-18: 15:00:00 via INTRAVENOUS

## 2019-08-18 MED ORDER — MIDAZOLAM HCL 5 MG/5ML IJ SOLN
INTRAMUSCULAR | Status: DC | PRN
  Administered 2019-08-18 (×2): 1 mg via INTRAVENOUS
  Administered 2019-08-18 (×4): 2 mg via INTRAVENOUS

## 2019-08-18 MED ORDER — DIPHENHYDRAMINE HCL 50 MG/ML IJ SOLN
INTRAMUSCULAR | Status: AC
  Filled 2019-08-18: qty 1

## 2019-08-18 MED ORDER — LACTATED RINGERS IV SOLN: INTRAVENOUS | Status: DC

## 2019-08-18 MED ORDER — FENTANYL CITRATE (PF) 100 MCG/2ML IJ SOLN
INTRAMUSCULAR | Status: AC
  Filled 2019-08-18: qty 4

## 2019-08-18 MED ORDER — DIPHENHYDRAMINE HCL 50 MG/ML IJ SOLN
INTRAMUSCULAR | Status: DC | PRN
  Administered 2019-08-18 (×2): 25 mg via INTRAVENOUS

## 2019-08-18 MED ORDER — SODIUM CHLORIDE 0.9 % IV SOLN: INTRAVENOUS | Status: DC

## 2019-08-18 MED ORDER — MIDAZOLAM HCL (PF) 5 MG/ML IJ SOLN
INTRAMUSCULAR | Status: AC
  Filled 2019-08-18: qty 2

## 2019-08-18 NOTE — Progress Notes (Signed)
PROGRESS NOTE    Peter Armstrong  ZOX:[id number] DOB: 07-Jun-1976 DOA: 08/16/2019 PCP: Patient, No Pcp Per    Brief Narrative:  43 y.o. male with medical history significant for remote history of colitis and alcohol use who presents to the ED for evaluation of bleeding per rectum.  Patient states he first noticed experiencing loose stools with maroon-colored blood 3 days ago.  Symptoms have been persistent and today he began to have bright red blood per rectum.  He presented to the ED for further evaluation and while here had another episode of rectal bleeding and while on the bedside commode became lightheaded and felt as if he was almost in a pass out.  He denies any melenic appearing stools.  He reports occasional stomach pain. He denies any other obvious bleeding including epistaxis, hemoptysis, hematemesis, or hematuria.  Patient reports a history of colitis since about 19-20 years ago but denies any prior bleeding episodes.  He reports taking a low-dose aspirin on occasion but not recently.  He avoids NSAIDs.  He reports alcohol use with a sixpack of beer every night.  He denies any tobacco use.  He reports occasional marijuana use and denies any IV drug or cocaine use.  He has not had a prior upper endoscopy or colonoscopy.  He reports a history of GI bleeding in his father and grandparents.   ED Course:  Initial vitals showed BP 149/102, pulse 120, RR 18, temp 98.3 Fahrenheit, SPO2 100% on room air.  Orthostatic vital signs were obtained and showed tachycardic response from lying to sitting position and 18 point drop in systolic BP from lying to standing.  Labs are notable for WBC 8.3, hemoglobin 11.2, hematocrit 35.5, MCV 97, platelets 217,000, sodium 137, potassium 3.9, bicarb 25, BUN 17, creatinine 1.14, serum glucose 158, LFTs within normal limits.  FOBT was positive.  Rectal exam.  EP showed gross blood.  Patient was given 2 L normal saline and the hospitalist service was consulted  to place in observation for evaluation of rectal bleeding  Assessment & Plan:   Principal Problem:   Painless rectal bleeding Active Problems:   Alcohol use   Orthostatic hypotension  Acute blood loss anemia from diverticular bleed: -Large BRBPR noted while in ED with near syncopal episode -Hgb down to 7.0 from 11.2, now s/p 2 units PRBC's -Denies abd pain, no nausea - No further bleeding overnight -Appreciate assistance by GI. Pt now s/p colonoscopy with findings of diverticulosis and evidence of prior bleeding, none actively. -Repeat CBC in AM  Alcohol use: -Pt preported drinking a 6 pack of beer nightly with last drink 2 days ago.   -cessation was done at time of presentation -Continued on CIWA  DVT prophylaxis: SCD's Code Status: Full Family Communication: Pt in room, family not at bedside Disposition Plan: Uncertain at this time  Consultants:   GI  Procedures:   Colonoscopy 8/27  Antimicrobials: Anti-infectives (From admission, onward)   None      Subjective: Reports no further bleeding overnight. Denies abd pain  Objective: Vitals:   08/18/19 1600 08/18/19 1605 08/18/19 1608 08/18/19 1617  BP: 114/62 126/63 114/63 115/63  Pulse: 81 86 88 78  Resp: 17 16  17   Temp:    97.9 F (36.6 C)  TempSrc:    Oral  SpO2: 100% 100% 100% 95%  Weight:      Height:        Intake/Output Summary (Last 24 hours) at 08/18/2019 1636 Last data filed at  08/17/2019 1700 Gross per 24 hour  Intake 240 ml  Output --  Net 240 ml   Filed Weights   08/16/19 1931  Weight: 80.7 kg    Examination: General exam: Awake, laying in bed, in nad Respiratory system: Normal respiratory effort, no wheezing Cardiovascular system: regular rate, s1, s2 Gastrointestinal system: Soft, nondistended, positive BS Central nervous system: CN2-12 grossly intact, strength intact Extremities: Perfused, no clubbing Skin: Normal skin turgor, no notable skin lesions seen Psychiatry: Mood  normal // no visual hallucinations   Data Reviewed: I have personally reviewed following labs and imaging studies  CBC: Recent Labs  Lab 08/16/19 1833 08/17/19 0441 08/17/19 1636 08/18/19 0354  WBC 8.3 8.8  --  8.5  HGB 11.2* 7.0* 10.0* 8.2*  HCT 35.5* 22.5* 30.3* 25.3*  MCV 97.0 98.3  --  95.5  PLT 217 152  --  137*   Basic Metabolic Panel: Recent Labs  Lab 08/16/19 1833 08/17/19 0441  NA 137 139  K 3.9 4.4  CL 103 110  CO2 25 26  GLUCOSE 150* 123*  BUN 17 16  CREATININE 1.14 0.88  CALCIUM 8.7* 7.4*   GFR: Estimated Creatinine Clearance: 123.5 mL/min (by C-G formula based on SCr of 0.88 mg/dL). Liver Function Tests: Recent Labs  Lab 08/16/19 1833  AST 21  ALT 19  ALKPHOS 64  BILITOT 0.4  PROT 6.8  ALBUMIN 3.5   No results for input(s): LIPASE, AMYLASE in the last 168 hours. No results for input(s): AMMONIA in the last 168 hours. Coagulation Profile: Recent Labs  Lab 08/16/19 1955 08/17/19 0617  INR 1.0 1.2   Cardiac Enzymes: No results for input(s): CKTOTAL, CKMB, CKMBINDEX, TROPONINI in the last 168 hours. BNP (last 3 results) No results for input(s): PROBNP in the last 8760 hours. HbA1C: No results for input(s): HGBA1C in the last 72 hours. CBG: No results for input(s): GLUCAP in the last 168 hours. Lipid Profile: No results for input(s): CHOL, HDL, LDLCALC, TRIG, CHOLHDL, LDLDIRECT in the last 72 hours. Thyroid Function Tests: No results for input(s): TSH, T4TOTAL, FREET4, T3FREE, THYROIDAB in the last 72 hours. Anemia Panel: No results for input(s): VITAMINB12, FOLATE, FERRITIN, TIBC, IRON, RETICCTPCT in the last 72 hours. Sepsis Labs: Recent Labs  Lab 08/17/19 0617 08/17/19 1636  LATICACIDVEN 1.1 1.4    Recent Results (from the past 240 hour(s))  SARS CORONAVIRUS 2 (TAT 6-12 HRS) Nasal Swab Aptima Multi Swab     Status: None   Collection Time: 08/16/19  8:40 PM   Specimen: Aptima Multi Swab; Nasal Swab  Result Value Ref Range Status    SARS Coronavirus 2 NEGATIVE NEGATIVE Final    Comment: (NOTE) SARS-CoV-2 target nucleic acids are NOT DETECTED. The SARS-CoV-2 RNA is generally detectable in upper and lower respiratory specimens during the acute phase of infection. Negative results do not preclude SARS-CoV-2 infection, do not rule out co-infections with other pathogens, and should not be used as the sole basis for treatment or other patient management decisions. Negative results must be combined with clinical observations, patient history, and epidemiological information. The expected result is Negative. Fact Sheet for Patients: HairSlick.no Fact Sheet for Healthcare Providers: quierodirigir.com This test is not yet approved or cleared by the [place] FDA and  has been authorized for detection and/or diagnosis of SARS-CoV-2 by FDA under an Emergency Use Authorization (EUA). This EUA will remain  in effect (meaning this test can be used) for the duration of the COVID-19 declaration under Section 56  4(b)(1) of the Act, 21 U.S.C. section 360bbb-3(b)(1), unless the authorization is terminated or revoked sooner. Performed at [facility], [address], [place], [place] [address]      Radiology Studies: No results found.  Scheduled Meds:  sodium chloride   Intravenous Once   folic acid  1 mg Oral Daily   LORazepam  0-4 mg Oral Q6H   Followed by   Derrill Memo ON 08/19/2019] LORazepam  0-4 mg Oral Q12H   multivitamin with minerals  1 tablet Oral Daily   sodium chloride flush  3 mL Intravenous Q12H   thiamine  100 mg Oral Daily   Or   thiamine  100 mg Intravenous Daily   Continuous Infusions:   LOS: 1 day   Marylu Lund, MD [facility] Pager On Amion  If 7PM-7AM, please contact night-coverage 08/18/2019, 4:36 PM

## 2019-08-18 NOTE — Op Note (Signed)
[facility] Patient Name: Peter Armstrong Procedure Date: 08/18/2019 MRN: [id number] Attending MD: Carol Ada , MD Date of Birth: Nov 09, 1976 CSN: [id number] Age: 43 Admit Type: Inpatient Procedure:                Colonoscopy Indications:              Hematochezia Providers:                Carol Ada, MD, Cleda Daub, RN, Ashley Jacobs,                            RN, William Dalton, Technician Referring MD:              Medicines:                Fentanyl 100 micrograms IV, Midazolam 10 mg IV,                            Diphenhydramine 50 mg IV Complications:            No immediate complications. Estimated Blood Loss:     Estimated blood loss: none. Procedure:                Pre-Anesthesia Assessment:                           - Prior to the procedure, a History and Physical                            was performed, and patient medications and                            allergies were reviewed. The patient's tolerance of                            previous anesthesia was also reviewed. The risks                            and benefits of the procedure and the sedation                            options and risks were discussed with the patient.                            All questions were answered, and informed consent                            was obtained. Prior Anticoagulants: The patient has                            taken no previous anticoagulant or antiplatelet                            agents. ASA Grade Assessment: I - A normal, healthy  patient. After reviewing the risks and benefits,                            the patient was deemed in satisfactory condition to                            undergo the procedure.                           - Sedation was administered by an endoscopy nurse.                            The sedation level attained was moderate.                           After obtaining informed consent, the  colonoscope                            was passed under direct vision. Throughout the                            procedure, the patient's blood pressure, pulse, and                            oxygen saturations were monitored continuously. The                            [id number]-[id number] ([id number] Olympus pediatric colonscope                            was introduced through the anus and advanced to the                            the cecum, identified by appendiceal orifice and                            ileocecal valve. The colonoscopy was somewhat                            difficult due to significant looping. Successful                            completion of the procedure was aided by using                            manual pressure and straightening and shortening                            the scope to obtain bowel loop reduction. The                            patient tolerated the procedure well. Scope In: 3:24:49 PM Scope Out: 3:44:04 PM Scope Withdrawal Time: 0 hours 10 minutes 42 seconds  Total Procedure Duration: 0 hours 19  minutes 15 seconds  Findings:      Scattered small and large-mouthed diverticula were found in the entire       colon. Some blood clots were found in the colon. The current findings       are consistent with a diverticular bleed. Impression:               - Diverticulosis in the entire examined colon.                           - No specimens collected. Moderate Sedation:      Moderate (conscious) sedation was administered by the endoscopy nurse       and supervised by the endoscopist. The following parameters were       monitored: oxygen saturation, heart rate, blood pressure, and response       to care. Recommendation:           - Return patient to hospital ward for ongoing care.                           - Resume regular diet.                           - Continue present medications.                           - Okay to D/C home tomorrow. Procedure Code(s):         --- Professional ---                           [phone], Colonoscopy, flexible; diagnostic, including                            collection of specimen(s) by brushing or washing,                            when performed (separate procedure) Diagnosis Code(s):        --- Professional ---                           K92.1, Melena (includes Hematochezia)                           K57.30, Diverticulosis of large intestine without                            perforation or abscess without bleeding CPT copyright 2019 American Medical Association. All rights reserved. The codes documented in this report are preliminary and upon coder review may  be revised to meet current compliance requirements. Jeani Hawking, MD Jeani Hawking, MD 08/18/2019 3:51:09 PM This report has been signed electronically. Number of Addenda: 0

## 2019-08-19 DIAGNOSIS — K922 Gastrointestinal hemorrhage, unspecified: Secondary | ICD-10-CM

## 2019-08-19 LAB — CBC
HCT: 25.1 % — ABNORMAL LOW (ref 39.0–52.0)
Hemoglobin: 8.1 g/dL — ABNORMAL LOW (ref 13.0–17.0)
MCH: 30.8 pg (ref 26.0–34.0)
MCHC: 32.3 g/dL (ref 30.0–36.0)
MCV: 95.4 fL (ref 80.0–100.0)
Platelets: 152 10*3/uL (ref 150–400)
RBC: 2.63 MIL/uL — ABNORMAL LOW (ref 4.22–5.81)
RDW: 13.5 % (ref 11.5–15.5)
WBC: 6.3 10*3/uL (ref 4.0–10.5)
nRBC: 0 % (ref 0.0–0.2)

## 2019-08-19 MED ORDER — FERROUS SULFATE 325 (65 FE) MG PO TABS: 325.0000 mg | ORAL_TABLET | Freq: Every day | ORAL | 0 refills | Status: AC

## 2019-08-19 MED ORDER — DOCUSATE SODIUM 100 MG PO CAPS: 100.0000 mg | ORAL_CAPSULE | Freq: Two times a day (BID) | ORAL | 0 refills | Status: AC

## 2019-08-19 NOTE — Progress Notes (Signed)
Subjective: No complaints.  Feeling well.  Objective: Vital signs in last 24 hours: Temp:  [97.9 F (36.6 C)-100.2 F (37.9 C)] 99.4 F (37.4 C) (08/28 0524) Pulse Rate:  [78-94] 88 (08/28 0524) Resp:  [12-27] 20 (08/28 0524) BP: (114-153)/(58-93) 117/69 (08/28 0524) SpO2:  [95 %-100 %] 97 % (08/28 0524) Last BM Date: 08/18/19  Intake/Output from previous day: 08/27 0701 - 08/28 0700 In: 631.3 [P.O.:600; I.V.:31.3] Out: -  Intake/Output this shift: No intake/output data recorded.  General appearance: alert and no distress GI: soft, non-tender; bowel sounds normal; no masses,  no organomegaly  Lab Results: Recent Labs    08/17/19 0441 08/17/19 1636 08/18/19 0354 08/19/19 0352  WBC 8.8  --  8.5 6.3  HGB 7.0* 10.0* 8.2* 8.1*  HCT 22.5* 30.3* 25.3* 25.1*  PLT 152  --  137* 152   BMET Recent Labs    08/16/19 1833 08/17/19 0441  NA 137 139  K 3.9 4.4  CL 103 110  CO2 25 26  GLUCOSE 150* 123*  BUN 17 16  CREATININE 1.14 0.88  CALCIUM 8.7* 7.4*   LFT Recent Labs    08/16/19 1833  PROT 6.8  ALBUMIN 3.5  AST 21  ALT 19  ALKPHOS 64  BILITOT 0.4   PT/INR Recent Labs    08/16/19 1955 08/17/19 0617  LABPROT 13.5 14.8  INR 1.0 1.2   Hepatitis Panel No results for input(s): HEPBSAG, HCVAB, HEPAIGM, HEPBIGM in the last 72 hours. C-Diff No results for input(s): CDIFFTOX in the last 72 hours. Fecal Lactopherrin No results for input(s): FECLLACTOFRN in the last 72 hours.  Studies/Results: No results found.  Medications:  Scheduled: . sodium chloride   Intravenous Once  . folic acid  1 mg Oral Daily  . LORazepam  0-4 mg Oral Q6H   Followed by  . LORazepam  0-4 mg Oral Q12H  . multivitamin with minerals  1 tablet Oral Daily  . sodium chloride flush  3 mL Intravenous Q12H  . thiamine  100 mg Oral Daily   Or  . thiamine  100 mg Intravenous Daily   Continuous:   Assessment/Plan: 1) Diverticular bleed. 2) Anemia.   The patient is well.  The  nature of his bleeding was discussed with the patient.  Diverticular bleeds recur randomly.  He can supplement with oral iron and follow up with his PCP.  Plan: 1) Oral iron. 2) Signing off.  LOS: 2 days   Peter Armstrong D 08/19/2019, 8:15 AM

## 2019-08-19 NOTE — Progress Notes (Signed)
Dc home with his wie. He verbalized understanding of appts and medications.

## 2019-08-19 NOTE — TOC Progression Note (Signed)
Transition of Care [facility]) - Progression Note    Patient Details  Name: Peter Armstrong MRN: [id number] Date of Birth: September 10, 1976  Transition of Care [facility]) CM/SW Contact  Purcell Mouton, RN Phone Number: 08/19/2019, 10:46 AM  Clinical Narrative:     Appointment at [facility] for Sept 4th at 1:00PM Pt is aware and agrees.        Expected Discharge Plan and Services           Expected Discharge Date: 08/19/19                                     Social Determinants of Health (SDOH) Interventions    Readmission Risk Interventions No flowsheet data found.

## 2019-08-19 NOTE — Discharge Summary (Signed)
Physician Discharge Summary  Peter Armstrong RUE:[id number] DOB: Jun 19, 1976 DOA: 08/16/2019  PCP: Peter Armstrong, No Pcp Per  Admit date: 08/16/2019 Discharge date: 08/19/2019  Admitted From: Home Disposition:  Home  Recommendations for Outpatient Follow-up:  1. Follow up with PCP in 1-2 weeks 2. Please repeat CBC in 1 week  Discharge Condition:Stable CODE STATUS:Full Diet recommendation: Regular   Brief/Interim Summary: 43 y.o.malewith medical history significant forremote history of colitis and alcohol use who presents to the ED for evaluation of bleeding per rectum. Peter Armstrong states he first noticed experiencing loose stools with maroon-colored blood 3 days ago. Symptoms have been persistent and today he began to have bright red blood per rectum. He presented to the ED for further evaluation and while here had another episode of rectal bleeding and while on the bedside commode became lightheaded and felt as if he was almost in a pass out. He denies any melenic appearing stools. He reports occasional stomach pain. He denies any other obvious bleeding including epistaxis, hemoptysis, hematemesis, or hematuria.  Peter Armstrong reports a history of colitis since about 19-20 years ago but denies any prior bleeding episodes. He reports taking a low-dose aspirin on occasion but not recently. He avoids NSAIDs. He reports alcohol use with a sixpack of beer every night. He denies any tobacco use. He reports occasional marijuana use and denies any IV drug or cocaine use. He has not had a prior upper endoscopy or colonoscopy. He reports a history of GI bleeding in his father and grandparents.  ED Course: Initial vitals showed BP 149/102, pulse 120, RR 18, temp 98.3 Fahrenheit, SPO2 100% on room air. Orthostatic vital signs were obtained and showed tachycardic response from lying to sitting position and 18 point drop in systolic BP from lying to standing.  Labs are notable for WBC 8.3, hemoglobin 11.2,  hematocrit 35.5, MCV 97, platelets 217,000, sodium 137, potassium 3.9, bicarb 25, BUN 17, creatinine 1.14, serum glucose 158, LFTs within normal limits.  FOBT was positive. Rectal exam. EP showed gross blood. Peter Armstrong was given 2 L normal saline and the hospitalist service was consulted to place in observation for evaluation of rectal bleeding  Discharge Diagnoses:  Principal Problem:   Painless rectal bleeding Active Problems:   Alcohol use   Orthostatic hypotension  Acute blood loss anemia from diverticular bleed: -Large BRBPR noted while in ED with near syncopal episode -Hgb down to 7.0 from 11.2 shortly after presentation, now s/p 2 units PRBC's -Denied abd pain, no nausea - No further bleeding the remainder of admit -Appreciate assistance by GI. Pt now s/p colonoscopy with findings of diverticulosis and evidence of prior bleeding, none actively. -CBC remained stable. Will discharge with iron supplementation -Recommend outpatient repeat CBC in 1 week. Follow up with PCP as scheduled  Alcohol use: -Pt preported drinkinga6 pack of beer nightly with last drink 2 days prior to visit  -cessation was done at time of presentation -Continued on CIWA while in hospital with no evidence of withdrawals  Discharge Instructions   Allergies as of 08/19/2019   No Known Allergies     Medication List    STOP taking these medications   HYDROcodone-acetaminophen 5-325 MG tablet Commonly known as: NORCO/VICODIN     TAKE these medications   acetaminophen 500 MG tablet Commonly known as: TYLENOL Take 500 mg by mouth every 6 (six) hours as needed for moderate pain.   aspirin EC 81 MG tablet Take 81 mg by mouth every 6 (six) hours as needed for moderate  pain.   docusate sodium 100 MG capsule Commonly known as: Colace Take 1 capsule (100 mg total) by mouth 2 (two) times daily.   ferrous sulfate 325 (65 FE) MG tablet Take 1 tablet (325 mg total) by mouth daily.       No  Known Allergies  Consultations:  GI  Procedures/Studies:  No results found.  Subjective: Eager to go home  Discharge Exam: Vitals:   08/19/19 0019 08/19/19 0524  BP: 128/64 117/69  Pulse: 83 88  Resp: 12 20  Temp: 98.9 F (37.2 C) 99.4 F (37.4 C)  SpO2: 98% 97%   Vitals:   08/18/19 2039 08/18/19 2100 08/19/19 0019 08/19/19 0524  BP:   128/64 117/69  Pulse:   83 88  Resp:   12 20  Temp: 100.2 F (37.9 C) 99.7 F (37.6 C) 98.9 F (37.2 C) 99.4 F (37.4 C)  TempSrc:   Oral Oral  SpO2:   98% 97%  Weight:      Height:        General: Pt is alert, awake, not in acute distress Cardiovascular: RRR, S1/S2 +, no rubs, no gallops Respiratory: CTA bilaterally, no wheezing, no rhonchi Abdominal: Soft, NT, ND, bowel sounds + Extremities: no edema, no cyanosis   The results of significant diagnostics from this hospitalization (including imaging, microbiology, ancillary and laboratory) are listed below for reference.     Microbiology: Recent Results (from the past 240 hour(s))  SARS CORONAVIRUS 2 (TAT 6-12 HRS) Nasal Swab Aptima Multi Swab     Status: None   Collection Time: 08/16/19  8:40 PM   Specimen: Aptima Multi Swab; Nasal Swab  Result Value Ref Range Status   SARS Coronavirus 2 NEGATIVE NEGATIVE Final    Comment: (NOTE) SARS-CoV-2 target nucleic acids are NOT DETECTED. The SARS-CoV-2 RNA is generally detectable in upper and lower respiratory specimens during the acute phase of infection. Negative results do not preclude SARS-CoV-2 infection, do not rule out co-infections with other pathogens, and should not be used as the sole basis for treatment or other Peter Armstrong management decisions. Negative results must be combined with clinical observations, Peter Armstrong history, and epidemiological information. The expected result is Negative. Fact Sheet for Patients: SugarRoll.be Fact Sheet for Healthcare  Providers: https://www.woods-mathews.com/ This test is not yet approved or cleared by the [place] FDA and  has been authorized for detection and/or diagnosis of SARS-CoV-2 by FDA under an Emergency Use Authorization (EUA). This EUA will remain  in effect (meaning this test can be used) for the duration of the COVID-19 declaration under Section 56 4(b)(1) of the Act, 21 U.S.C. section 360bbb-3(b)(1), unless the authorization is terminated or revoked sooner. Performed at [facility], [place] [address] [place], [place] [address]      Labs: BNP (last 3 results) No results for input(s): BNP in the last 8760 hours. Basic Metabolic Panel: Recent Labs  Lab 08/16/19 1833 08/17/19 0441  NA 137 139  K 3.9 4.4  CL 103 110  CO2 25 26  GLUCOSE 150* 123*  BUN 17 16  CREATININE 1.14 0.88  CALCIUM 8.7* 7.4*   Liver Function Tests: Recent Labs  Lab 08/16/19 1833  AST 21  ALT 19  ALKPHOS 64  BILITOT 0.4  PROT 6.8  ALBUMIN 3.5   No results for input(s): LIPASE, AMYLASE in the last 168 hours. No results for input(s): AMMONIA in the last 168 hours. CBC: Recent Labs  Lab 08/16/19 1833 08/17/19 0441 08/17/19 1636 08/18/19 0354  08/19/19 0352  WBC 8.3 8.8  --  8.5 6.3  HGB 11.2* 7.0* 10.0* 8.2* 8.1*  HCT 35.5* 22.5* 30.3* 25.3* 25.1*  MCV 97.0 98.3  --  95.5 95.4  PLT 217 152  --  137* 152   Cardiac Enzymes: No results for input(s): CKTOTAL, CKMB, CKMBINDEX, TROPONINI in the last 168 hours. BNP: Invalid input(s): POCBNP CBG: No results for input(s): GLUCAP in the last 168 hours. D-Dimer No results for input(s): DDIMER in the last 72 hours. Hgb A1c No results for input(s): HGBA1C in the last 72 hours. Lipid Profile No results for input(s): CHOL, HDL, LDLCALC, TRIG, CHOLHDL, LDLDIRECT in the last 72 hours. Thyroid function studies No results for input(s): TSH, T4TOTAL, T3FREE, THYROIDAB in the last 72 hours.  Invalid input(s): FREET3 Anemia work  up No results for input(s): VITAMINB12, FOLATE, FERRITIN, TIBC, IRON, RETICCTPCT in the last 72 hours. Urinalysis    Component Value Date/Time   COLORURINE YELLOW 05/08/2008 0915   APPEARANCEUR CLEAR 05/08/2008 0915   LABSPEC 1.012 05/08/2008 0915   PHURINE 7.0 05/08/2008 0915   GLUCOSEU NEGATIVE 05/08/2008 0915   HGBUR NEGATIVE 05/08/2008 0915   BILIRUBINUR NEGATIVE 05/08/2008 0915   KETONESUR NEGATIVE 05/08/2008 0915   PROTEINUR NEGATIVE 05/08/2008 0915   UROBILINOGEN 0.2 05/08/2008 0915   NITRITE NEGATIVE 05/08/2008 0915   LEUKOCYTESUR  05/08/2008 0915    NEGATIVE MICROSCOPIC NOT DONE ON URINES WITH NEGATIVE PROTEIN, BLOOD, LEUKOCYTES, NITRITE, OR GLUCOSE <1000 mg/dL.   Sepsis Labs Invalid input(s): PROCALCITONIN,  WBC,  LACTICIDVEN Microbiology Recent Results (from the past 240 hour(s))  SARS CORONAVIRUS 2 (TAT 6-12 HRS) Nasal Swab Aptima Multi Swab     Status: None   Collection Time: 08/16/19  8:40 PM   Specimen: Aptima Multi Swab; Nasal Swab  Result Value Ref Range Status   SARS Coronavirus 2 NEGATIVE NEGATIVE Final    Comment: (NOTE) SARS-CoV-2 target nucleic acids are NOT DETECTED. The SARS-CoV-2 RNA is generally detectable in upper and lower respiratory specimens during the acute phase of infection. Negative results do not preclude SARS-CoV-2 infection, do not rule out co-infections with other pathogens, and should not be used as the sole basis for treatment or other Peter Armstrong management decisions. Negative results must be combined with clinical observations, Peter Armstrong history, and epidemiological information. The expected result is Negative. Fact Sheet for Patients: HairSlick.no Fact Sheet for Healthcare Providers: quierodirigir.com This test is not yet approved or cleared by the [place] FDA and  has been authorized for detection and/or diagnosis of SARS-CoV-2 by FDA under an Emergency Use Authorization  (EUA). This EUA will remain  in effect (meaning this test can be used) for the duration of the COVID-19 declaration under Section 56 4(b)(1) of the Act, 21 U.S.C. section 360bbb-3(b)(1), unless the authorization is terminated or revoked sooner. Performed at [facility], [address] [place], [place] [address]    Time spent: 30 min  SIGNED:   Rickey Barbara, MD  [facility] 08/19/2019, 9:54 AM  If 7PM-7AM, please contact night-coverage

## 2019-08-22 ENCOUNTER — Encounter (HOSPITAL_COMMUNITY): Payer: Self-pay | Admitting: Gastroenterology

## 2019-08-26 ENCOUNTER — Ambulatory Visit: Payer: Self-pay | Admitting: Family Medicine

## 2019-11-04 ENCOUNTER — Encounter (HOSPITAL_COMMUNITY): Payer: Self-pay

## 2019-11-04 ENCOUNTER — Other Ambulatory Visit: Payer: Self-pay

## 2019-11-04 ENCOUNTER — Emergency Department (HOSPITAL_COMMUNITY): Payer: Self-pay

## 2019-11-04 ENCOUNTER — Inpatient Hospital Stay (HOSPITAL_COMMUNITY)
Admission: EM | Admit: 2019-11-04 | Discharge: 2019-11-07 | DRG: 392 | Disposition: A | Discharge status: Home / Self Care | Payer: Self-pay | Attending: Internal Medicine | Admitting: Internal Medicine

## 2019-11-04 DIAGNOSIS — K5792 Diverticulitis of intestine, part unspecified, without perforation or abscess without bleeding: Secondary | ICD-10-CM

## 2019-11-04 DIAGNOSIS — E876 Hypokalemia: Secondary | ICD-10-CM | POA: Diagnosis present

## 2019-11-04 DIAGNOSIS — R1031 Right lower quadrant pain: Secondary | ICD-10-CM

## 2019-11-04 DIAGNOSIS — K5780 Diverticulitis of intestine, part unspecified, with perforation and abscess without bleeding: Secondary | ICD-10-CM | POA: Diagnosis present

## 2019-11-04 DIAGNOSIS — Z20828 Contact with and (suspected) exposure to other viral communicable diseases: Secondary | ICD-10-CM | POA: Diagnosis present

## 2019-11-04 DIAGNOSIS — K5720 Diverticulitis of large intestine with perforation and abscess without bleeding: Principal | ICD-10-CM | POA: Diagnosis present

## 2019-11-04 DIAGNOSIS — D509 Iron deficiency anemia, unspecified: Secondary | ICD-10-CM | POA: Diagnosis present

## 2019-11-04 DIAGNOSIS — Z79899 Other long term (current) drug therapy: Secondary | ICD-10-CM

## 2019-11-04 DIAGNOSIS — Z791 Long term (current) use of non-steroidal anti-inflammatories (NSAID): Secondary | ICD-10-CM

## 2019-11-04 LAB — COMPREHENSIVE METABOLIC PANEL
ALT: 22 U/L (ref 0–44)
AST: 26 U/L (ref 15–41)
Albumin: 3.9 g/dL (ref 3.5–5.0)
Alkaline Phosphatase: 75 U/L (ref 38–126)
Anion gap: 9 (ref 5–15)
BUN: 14 mg/dL (ref 6–20)
CO2: 23 mmol/L (ref 22–32)
Calcium: 8.8 mg/dL — ABNORMAL LOW (ref 8.9–10.3)
Chloride: 105 mmol/L (ref 98–111)
Creatinine, Ser: 0.92 mg/dL (ref 0.61–1.24)
GFR calc Af Amer: >60 mL/min (ref >60)
GFR calc non Af Amer: >60 mL/min (ref >60)
Glucose, Bld: 100 mg/dL — ABNORMAL HIGH (ref 70–99)
Potassium: 3.7 mmol/L (ref 3.5–5.1)
Sodium: 137 mmol/L (ref 135–145)
Total Bilirubin: 0.4 mg/dL (ref 0.3–1.2)
Total Protein: 7.6 g/dL (ref 6.5–8.1)

## 2019-11-04 LAB — CBC
HCT: 40.6 % (ref 39.0–52.0)
Hemoglobin: 12.8 g/dL — ABNORMAL LOW (ref 13.0–17.0)
MCH: 30.1 pg (ref 26.0–34.0)
MCHC: 31.5 g/dL (ref 30.0–36.0)
MCV: 95.5 fL (ref 80.0–100.0)
Platelets: 217 10*3/uL (ref 150–400)
RBC: 4.25 MIL/uL (ref 4.22–5.81)
RDW: 14 % (ref 11.5–15.5)
WBC: 9.3 10*3/uL (ref 4.0–10.5)
nRBC: 0 % (ref 0.0–0.2)

## 2019-11-04 LAB — URINALYSIS, ROUTINE W REFLEX MICROSCOPIC
Bilirubin Urine: NEGATIVE
Glucose, UA: NEGATIVE mg/dL
Hgb urine dipstick: NEGATIVE
Ketones, ur: NEGATIVE mg/dL
Leukocytes,Ua: NEGATIVE
Nitrite: NEGATIVE
Protein, ur: NEGATIVE mg/dL
Specific Gravity, Urine: 1.01 (ref 1.005–1.030)
pH: 5 (ref 5.0–8.0)

## 2019-11-04 LAB — LACTIC ACID, PLASMA
Lactic Acid, Venous: 0.9 mmol/L (ref 0.5–1.9)
Lactic Acid, Venous: 1.1 mmol/L (ref 0.5–1.9)

## 2019-11-04 LAB — LIPASE, BLOOD: Lipase: 28 U/L (ref 11–51)

## 2019-11-04 MED ORDER — ONDANSETRON HCL 4 MG/2ML IJ SOLN: 4.0000 mg | Freq: Four times a day (QID) | INTRAMUSCULAR | Status: DC | PRN

## 2019-11-04 MED ORDER — HYDROCODONE-ACETAMINOPHEN 5-325 MG PO TABS
1.0000 | ORAL_TABLET | ORAL | Status: DC | PRN
  Administered 2019-11-05: 1 via ORAL
  Administered 2019-11-05 (×3): 2 via ORAL
  Administered 2019-11-05: 02:00:00 1 via ORAL
  Administered 2019-11-06 (×2): 2 via ORAL
  Filled 2019-11-04 (×6): qty 2

## 2019-11-04 MED ORDER — PIPERACILLIN-TAZOBACTAM 3.375 G IVPB 30 MIN
3.3750 g | Freq: Once | INTRAVENOUS | Status: AC
  Administered 2019-11-04: 20:00:00 3.375 g via INTRAVENOUS
  Filled 2019-11-04: qty 50

## 2019-11-04 MED ORDER — HYDROMORPHONE HCL 1 MG/ML IJ SOLN
0.5000 mg | INTRAMUSCULAR | Status: DC | PRN
  Administered 2019-11-04 – 2019-11-06 (×5): 0.5 mg via INTRAVENOUS
  Filled 2019-11-04 (×5): qty 0.5

## 2019-11-04 MED ORDER — ONDANSETRON HCL 4 MG PO TABS: 4.0000 mg | ORAL_TABLET | Freq: Four times a day (QID) | ORAL | Status: DC | PRN

## 2019-11-04 MED ORDER — SODIUM CHLORIDE 0.9 % IV SOLN
INTRAVENOUS | Status: AC
  Administered 2019-11-05: 01:00:00 via INTRAVENOUS

## 2019-11-04 MED ORDER — PIPERACILLIN-TAZOBACTAM 3.375 G IVPB
3.3750 g | Freq: Three times a day (TID) | INTRAVENOUS | Status: DC
  Administered 2019-11-05 – 2019-11-07 (×7): 3.375 g via INTRAVENOUS
  Filled 2019-11-04 (×7): qty 50

## 2019-11-04 MED ORDER — ACETAMINOPHEN 650 MG RE SUPP: 650.0000 mg | Freq: Four times a day (QID) | RECTAL | Status: DC | PRN

## 2019-11-04 MED ORDER — SODIUM CHLORIDE 0.9% FLUSH
3.0000 mL | Freq: Once | INTRAVENOUS | Status: AC
  Administered 2019-11-04: 20:00:00 3 mL via INTRAVENOUS

## 2019-11-04 MED ORDER — ACETAMINOPHEN 325 MG PO TABS: 650.0000 mg | ORAL_TABLET | Freq: Four times a day (QID) | ORAL | Status: DC | PRN

## 2019-11-04 MED ORDER — IOHEXOL 300 MG/ML  SOLN
100.0000 mL | Freq: Once | INTRAMUSCULAR | Status: AC | PRN
  Administered 2019-11-04: 100 mL via INTRAVENOUS

## 2019-11-04 NOTE — Progress Notes (Signed)
Pharmacy Antibiotic Note  Peter Armstrong is a 43 y.o. male admitted on 11/04/2019 with IAI.  Pharmacy has been consulted for Zosyn dosing.  Plan: Zosyn 3.375g IV q8h (4 hour infusion).  Will sign off  Height: 6\' 3"  (190.5 cm) Weight: 177 lb (80.3 kg) IBW/kg (Calculated) : 84.5  Temp (24hrs), Avg:98.5 F (36.9 C), Min:98.5 F (36.9 C), Max:98.5 F (36.9 C)  Recent Labs  Lab 11/04/19 1524  WBC 9.3  CREATININE 0.92    Estimated Creatinine Clearance: 117.6 mL/min (by C-G formula based on SCr of 0.92 mg/dL).    No Known Allergies    Thank you for allowing pharmacy to be a part of this patient's care.  Kara Mead 11/04/2019 8:36 PM

## 2019-11-04 NOTE — ED Notes (Signed)
Only able to obtain one set of blood cultures prior to antibiotic administration.

## 2019-11-04 NOTE — H&P (Signed)
Peter Armstrong:[id number] DOB: [birth year] DOA: 11/04/2019     PCP: Patient, No Pcp Per   Outpatient Specialists:   . GI  Dr. Benson Norway      Patient arrived to ER on 11/04/19 at 1402  Patient coming from: home Lives   With family    Chief Complaint:   Chief Complaint  Patient presents with   Abdominal Pain    HPI: Peter Armstrong is a 43 y.o. male with medical history significant of   diverticulosis per colonoscopy  With GI bleed needing blood transfusion    Presented with   Abdominal pain RLQ pain, pain started 3 days ago was very sharp  chills no fever. He reports he has been eating a lot of hamburgers  He had similar episode when he was 19  Denies any n/V/D, no CP no SOB No exposure to [place] He has been isolating Work as Building control surveyor   Infectious risk factors:  Reports none In  ER RAPID COVID TEST  in house testing  Pending  Lab Results  Component Value Date   [place] 08/16/2019     Regarding pertinent Chronic problems:    none  While in ER: CT of abdomen showed diverticulitis with intramural abscess General surgery has been notified will see in consult patient was admitted for IV antibiotics  The following Work up has been ordered so far:  Orders Placed This Encounter  Procedures   Urine culture   Blood culture (routine x 2)   SARS CORONAVIRUS 2 (TAT 6-24 HRS) Nasopharyngeal Nasopharyngeal Swab   CT ABDOMEN PELVIS W CONTRAST   Lipase, blood   Comprehensive metabolic panel   CBC   Urinalysis, Routine w reflex microscopic   Lactic acid, plasma   Diet NPO time specified   Saline Lock IV, Maintain IV access   Consult to general surgery  ALL PATIENTS BEING ADMITTED/HAVING PROCEDURES NEED COVID-19 SCREENING   Consult to hospitalist  ALL PATIENTS BEING ADMITTED/HAVING PROCEDURES NEED COVID-19 SCREENING   Airborne and Contact precautions      Following Medications were ordered in ER: Medications  sodium chloride flush (NS) 0.9  % injection 3 mL (has no administration in time range)  piperacillin-tazobactam (ZOSYN) IVPB 3.375 g (has no administration in time range)  iohexol (OMNIPAQUE) 300 MG/ML solution 100 mL (100 mLs Intravenous Contrast Given 11/04/19 1805)        Consult Orders  (From admission, onward)         Start     Ordered   11/04/19 1916  Consult to hospitalist  ALL PATIENTS BEING ADMITTED/HAVING PROCEDURES NEED COVID-19 SCREENING  Once    Comments: ALL PATIENTS BEING ADMITTED/HAVING PROCEDURES NEED COVID-19 SCREENING  Provider:  (Not yet assigned)  Question Answer Comment  Place call to: [facility]   Reason for Consult Admit      11/04/19 1915          ER Provider Called:  gen surgery   Dr.Wilson They Recommend admit to medicine  pls start on ABX Will see in AM    Significant initial  Findings: Abnormal Labs Reviewed  COMPREHENSIVE METABOLIC PANEL - Abnormal; Notable for the following components:      Result Value   Glucose, Bld 100 (*)    Calcium 8.8 (*)    All other components within normal limits  CBC - Abnormal; Notable for the following components:   Hemoglobin 12.8 (*)    All other components within normal limits  URINALYSIS, ROUTINE  W REFLEX MICROSCOPIC - Abnormal; Notable for the following components:   Color, Urine STRAW (*)    All other components within normal limits    Otherwise labs showing:    Recent Labs  Lab 11/04/19 1524  NA 137  K 3.7  CO2 23  GLUCOSE 100*  BUN 14  CREATININE 0.92  CALCIUM 8.8*    Cr   Stable,  Lab Results  Component Value Date   CREATININE 0.92 11/04/2019   CREATININE 0.88 08/17/2019   CREATININE 1.14 08/16/2019    Recent Labs  Lab 11/04/19 1524  AST 26  ALT 22  ALKPHOS 75  BILITOT 0.4  PROT 7.6  ALBUMIN 3.9   Lab Results  Component Value Date   CALCIUM 8.8 (L) 11/04/2019      WBC      Component Value Date/Time   WBC 9.3 11/04/2019 1524   ANC    Component Value Date/Time   NEUTROABS 2.4  05/08/2008 0945   ALC No components found for: LYMPHAB    Plt: Lab Results  Component Value Date   PLT 217 11/04/2019     Lactic Acid, Venous    Component Value Date/Time   LATICACIDVEN 1.4 08/17/2019 1636      COVID-19 Labs  No results for input(s): DDIMER, FERRITIN, LDH, CRP in the last 72 hours.  Lab Results  Component Value Date   SARSCOV2NAA NEGATIVE 08/16/2019      HG/HCT   Stable,     Component Value Date/Time   HGB 12.8 (L) 11/04/2019 1524   HCT 40.6 11/04/2019 1524    Recent Labs  Lab 11/04/19 1524  LIPASE 28       ECG: not Ordered     UA  no evidence of UTI     Urine analysis:    Component Value Date/Time   COLORURINE STRAW (A) 11/04/2019 1757   APPEARANCEUR CLEAR 11/04/2019 1757   LABSPEC 1.010 11/04/2019 1757   PHURINE 5.0 11/04/2019 1757   GLUCOSEU NEGATIVE 11/04/2019 1757   HGBUR NEGATIVE 11/04/2019 1757   BILIRUBINUR NEGATIVE 11/04/2019 1757   KETONESUR NEGATIVE 11/04/2019 1757   PROTEINUR NEGATIVE 11/04/2019 1757   UROBILINOGEN 0.2 05/08/2008 0915   NITRITE NEGATIVE 11/04/2019 1757   LEUKOCYTESUR NEGATIVE 11/04/2019 1757      CTabd/pelvis -diverticulitis with intramural mural abscess      ED Triage Vitals  Enc Vitals Group     BP 11/04/19 1422 (!) 138/95     Pulse Rate 11/04/19 1422 85     Resp 11/04/19 1422 17     Temp 11/04/19 1422 98.5 F (36.9 C)     Temp Source 11/04/19 1422 Oral     SpO2 11/04/19 1422 100 %     Weight 11/04/19 1423 177 lb (80.3 kg)     Height 11/04/19 1423 6\' 3"  (1.905 m)     Head Circumference --      Peak Flow --      Pain Score 11/04/19 1422 10     Pain Loc --      Pain Edu? --      Excl. in GC? --   TMAX(24)@       Latest  Blood pressure (!) 152/88, pulse 75, temperature 98.5 F (36.9 C), temperature source Oral, resp. rate 17, height 6\' 3"  (1.905 m), weight 80.3 kg, SpO2 100 %.     Hospitalist was called for admission for diverticulitis with abcess    Review of Systems:     Pertinent positives  include: chills, fatigue,  abdominal pain, nausea,  Constitutional:  No weight loss, night sweats, Fevers,  weight loss  HEENT:  No headaches, Difficulty swallowing,Tooth/dental problems,Sore throat,  No sneezing, itching, ear ache, nasal congestion, post nasal drip,  Cardio-vascular:  No chest pain, Orthopnea, PND, anasarca, dizziness, palpitations.no Bilateral lower extremity swelling  GI:  No heartburn, indigestion, vomiting, diarrhea, change in bowel habits, loss of appetite, melena, blood in stool, hematemesis Resp:  no shortness of breath at rest. No dyspnea on exertion, No excess mucus, no productive cough, No non-productive cough, No coughing up of blood.No change in color of mucus.No wheezing. Skin:  no rash or lesions. No jaundice GU:  no dysuria, change in color of urine, no urgency or frequency. No straining to urinate.  No flank pain.  Musculoskeletal:  No joint pain or no joint swelling. No decreased range of motion. No back pain.  Psych:  No change in mood or affect. No depression or anxiety. No memory loss.  Neuro: no localizing neurological complaints, no tingling, no weakness, no double vision, no gait abnormality, no slurred speech, no confusion  All systems reviewed and apart from HOPI all are negative  Past Medical History:  History reviewed. No pertinent past medical history.    Past Surgical History:  Procedure Laterality Date   COLONOSCOPY Left 08/18/2019   Procedure: COLONOSCOPY;  Surgeon: Jeani Hawking, MD;  Location: [facility];  Service: Endoscopy;  Laterality: Left;    Social History:  Ambulatory   independently       reports that he has never smoked. He has never used smokeless tobacco. He reports previous alcohol use. He reports current drug use. Drug: Marijuana.     Family History:   Family History  Problem Relation Age of Onset   GI Bleed Father     Allergies: No Known Allergies   Prior to Admission  medications   Medication Sig Start Date End Date Taking? Authorizing Provider  Aspirin-Caffeine (BC FAST PAIN RELIEF PO) Take 1 packet by mouth every 6 (six) hours as needed (pain).   Yes [provider]  docusate sodium (COLACE) 100 MG capsule Take 100 mg by mouth daily.   Yes [provider]  ferrous sulfate 325 (65 FE) MG tablet Take 1 tablet (325 mg total) by mouth daily. 08/19/19 08/18/20 Yes Jerald Kief, MD   Physical Exam: Blood pressure (!) 152/88, pulse 75, temperature 98.5 F (36.9 C), temperature source Oral, resp. rate 17, height  (1.905 m), weight 80.3 kg, SpO2 100 %. 1. General:  in No Acute distress   well -appearing 2. Psychological: Alert and   Oriented 3. Head/ENT:     Dry Mucous Membranes                          Head Non traumatic, neck supple                         Poor Dentition 4. SKIN:  decreased Skin turgor,  Skin clean Dry and intact no rash 5. Heart: Regular rate and rhythm no  Murmur, no Rub or gallop 6. Lungs:  Clear to auscultation bilaterally, no wheezes or crackles   7. Abdomen: Soft,RUL tenderness, Non distended bowel sounds present 8. Lower extremities: no clubbing, cyanosis, no  edema 9. Neurologically Grossly intact, moving all 4 extremities equally  10. MSK: Normal range of motion   All other LABS:  Recent Labs  Lab 11/04/19 1524  WBC 9.3  HGB 12.8*  HCT 40.6  MCV 95.5  PLT 217     Recent Labs  Lab 11/04/19 1524  NA 137  K 3.7  CL 105  CO2 23  GLUCOSE 100*  BUN 14  CREATININE 0.92  CALCIUM 8.8*     Recent Labs  Lab 11/04/19 1524  AST 26  ALT 22  ALKPHOS 75  BILITOT 0.4  PROT 7.6  ALBUMIN 3.9       Cultures: No results found for: SDES, SPECREQUEST, CULT, REPTSTATUS   Radiological Exams on Admission: Ct Abdomen Pelvis W Contrast  Addendum Date: 11/04/2019   ADDENDUM REPORT: 11/04/2019 18:50 ADDENDUM: These results were called by telephone at the time of interpretation on 11/04/2019 at  6:50 pm to provider [facility] , who verbally acknowledged these results. Electronically Signed   By: Donzetta Kohut M.D.   On: 11/04/2019 18:50   Result Date: 11/04/2019 CLINICAL DATA:  Abdominal pain, suspected appendicitis. EXAM: CT ABDOMEN AND PELVIS WITH CONTRAST TECHNIQUE: Multidetector CT imaging of the abdomen and pelvis was performed using the standard protocol following bolus administration of intravenous contrast. CONTRAST:  OMNIPAQUE IOHEXOL 300 MG/ML  SOLN COMPARISON:  None. FINDINGS: Lower chest: Is no sign of consolidation or pleural effusion. Hepatobiliary: No sign of focal hepatic lesion. Portal vein and SMV are patent. Biliary tree is normal. Pancreas: No signs of inflammation, ductal dilation or lesion. Spleen: Normal Adrenals/Urinary Tract: Normal appearance of bilateral adrenal glands. No signs of hydronephrosis. Symmetric enhancement of the kidneys. Stomach/Bowel: Stomach and small bowel are normal. There is stranding in the right lower quadrant that is centered about the ascending colon which shows severe changes of diverticular disease. There is a presumed intramural abscess along the lateral wall of the right colon. This measures 1.7 x 1.4 by 3.2 cm and is inseparable from the thickened colonic wall. This is approximately 2-3 cm distal to the ileocecal valve. The appendix is normal. Vascular/Lymphatic: Scattered atherosclerosis. No signs of adenopathy in the abdomen or retroperitoneum. No signs of pelvic lymphadenopathy. Reproductive: Prostate normal by CT, nonspecific finding. Other: No signs of free intraperitoneal air. Musculoskeletal: Degenerative changes, marked at L5-S1. No signs of acute or destructive bone process. IMPRESSION: 1. Signs of acute right-sided diverticulitis with pericolonic/intramural abscess and adjacent stranding. 2. Marked colonic thickening may simply be related to diverticular disease; however, follow-up colonoscopy or correlation with recent  colonoscopy results is suggested to exclude underlying neoplasm. 3. Atherosclerotic changes in the abdominal aorta. 4. Normal appendix. 5. A call is out to the referring provider to further discuss above findings. Aortic Atherosclerosis (ICD10-I70.0). Electronically Signed: By: Donzetta Kohut M.D. On: 11/04/2019 18:44    Chart has been reviewed    Assessment/Plan  43 y.o. male with medical history significant of   diverticulosis per colonoscopy  With GI bleed needing blood transfusion  Admitted for diverticultis with abcess   Present on Admission:  Diverticulitis of intestine with abscess -bowel rest.  Continue IV antibiotics IV Zosyn.  Appreciate general surgery consult.  Order IR consult to see if there is any possibility of draining the abscess.  If not we will continue conservative medical management.  Gently rehydrate and continue to follow fluid status. Covid testing path pending at the time but patient denies any risk factors   Other plan as per orders.  DVT prophylaxis:  SCD   Code Status:  FULL CODE   as per patient   I had  personally discussed CODE STATUS with patient    Family Communication:   Family not at  Bedside    Disposition Plan:       To home once workup is complete and patient is stable                       Consults called: general surgery  Admission status:  ED Disposition    ED Disposition Condition Comment   Admit  The patient appears reasonably stabilized for admission considering the current resources, flow, and capabilities available in the ED at this time, and I doubt any other [facility] requiring further screening and/or treatment in the ED prior to admission is  present.          inpatient     Expect 2 midnight stay secondary to severity of patient's current illness including     Severe lab/radiological/exam abnormalities including:  diverticultis     That are currently affecting medical management.   I expect  patient to be hospitalized for  2 midnights requiring inpatient medical care.  Patient is at high risk for adverse outcome (such as loss of life or disability) if not treated.  Indication for inpatient stay as follows:    severe pain requiring acute inpatient management,  inability to maintain oral hydration    Need for operative/procedural  intervention    Need for IV antibiotics, IV fluids IV pain medications,     Level of care       medical floor    Precautions:  NONE  Airborne and Contact precautions  PPE: Used by the provider:   P100  eye Goggles,  Gloves       Theotis Gerdeman 11/05/2019, 12:15 AM    [facility]     after 2 AM please page floor coverage PA If 7AM-7PM, please contact the day team taking care of the patient using [email or web address]

## 2019-11-04 NOTE — ED Triage Notes (Signed)
Patient c/o RLQ abdominal pain x 3 days.  Patient denies N/v/D. Patient states he feels constipated and states he is not passing gas.

## 2019-11-04 NOTE — ED Provider Notes (Signed)
[facility]-EMERGENCY DEPT Provider Note   CSN: [id number] Arrival date & time: 11/04/19  1402     History   Chief Complaint Chief Complaint  Patient presents with   Abdominal Pain    HPI Peter Armstrong is a 43 y.o. male.     The history is provided by the patient and medical records. No language interpreter was used.  Abdominal Pain Pain location:  RLQ Pain quality: aching, cramping and tearing   Pain radiates to:  RUQ Pain severity:  Severe Onset quality:  Gradual Duration:  4 days Timing:  Constant Progression:  Waxing and waning Chronicity:  New Context: not previous surgeries   Relieved by:  Nothing Worsened by:  Nothing Ineffective treatments:  NSAIDs and OTC medications Associated symptoms: chills, constipation and flatus (decreased)   Associated symptoms: no chest pain, no cough, no diarrhea, no dysuria, no fatigue, no fever, no nausea, no shortness of breath and no vomiting     History reviewed. No pertinent past medical history.  Patient Active Problem List   Diagnosis Date Noted   Lower GI bleed    Orthostatic hypotension 08/17/2019   Painless rectal bleeding 08/16/2019   Alcohol use 08/16/2019    Past Surgical History:  Procedure Laterality Date   COLONOSCOPY Left 08/18/2019   Procedure: COLONOSCOPY;  Surgeon: Jeani Hawking, MD;  Location: [facility];  Service: Endoscopy;  Laterality: Left;        Home Medications    Prior to Admission medications   Medication Sig Start Date End Date Taking? Authorizing Provider  Aspirin-Caffeine (BC FAST PAIN RELIEF PO) Take 1 packet by mouth every 6 (six) hours as needed (pain).   Yes [provider]  docusate sodium (COLACE) 100 MG capsule Take 100 mg by mouth daily.   Yes [provider]  ferrous sulfate 325 (65 FE) MG tablet Take 1 tablet (325 mg total) by mouth daily. 08/19/19 08/18/20 Yes Jerald Kief, MD    Family History Family History  Problem  Relation Age of Onset   GI Bleed Father     Social History Social History   Tobacco Use   Smoking status: Never Smoker   Smokeless tobacco: Never Used  Substance Use Topics   Alcohol use: Not Currently    Comment: 6 pack of beer/day   Drug use: Yes    Types: Marijuana    Comment: Occasional marijuana     Allergies   Patient has no known allergies.   Review of Systems Review of Systems  Constitutional: Positive for chills. Negative for diaphoresis, fatigue and fever.  HENT: Negative for congestion.   Eyes: Negative for visual disturbance.  Respiratory: Negative for cough, chest tightness, shortness of breath, wheezing and stridor.   Cardiovascular: Negative for chest pain and palpitations.  Gastrointestinal: Positive for abdominal pain, constipation and flatus (decreased). Negative for abdominal distention, blood in stool, diarrhea, nausea and vomiting.  Genitourinary: Negative for dysuria, flank pain and frequency.  Musculoskeletal: Negative for back pain, neck pain and neck stiffness.  Neurological: Negative for light-headedness and headaches.  Psychiatric/Behavioral: Negative for agitation.  All other systems reviewed and are negative.    Physical Exam Updated Vital Signs BP (!) 148/91 (BP Location: Left Arm)    Pulse 73    Temp 98.5 F (36.9 C) (Oral)    Resp 18    Ht 6\' 3"  (1.905 m)    Wt 80.3 kg    SpO2 100%    BMI 22.12 kg/m  Physical Exam Vitals signs and nursing note reviewed.  Constitutional:      General: He is not in acute distress.    Appearance: He is well-developed. He is not ill-appearing, toxic-appearing or diaphoretic.  HENT:     Head: Normocephalic and atraumatic.  Eyes:     Extraocular Movements: Extraocular movements intact.     Conjunctiva/sclera: Conjunctivae normal.  Neck:     Musculoskeletal: Neck supple.  Cardiovascular:     Rate and Rhythm: Normal rate and regular rhythm.     Heart sounds: Normal heart sounds. No murmur.    Pulmonary:     Effort: Pulmonary effort is normal. No respiratory distress.     Breath sounds: Normal breath sounds.  Abdominal:     General: Abdomen is flat. Bowel sounds are decreased. There is no distension.     Palpations: Abdomen is soft.     Tenderness: There is abdominal tenderness in the right upper quadrant, right lower quadrant and periumbilical area. There is no right CVA tenderness or left CVA tenderness.  Skin:    General: Skin is warm and dry.     Capillary Refill: Capillary refill takes less than 2 seconds.     Findings: No rash.  Neurological:     Mental Status: He is alert.  Psychiatric:        Mood and Affect: Mood normal.      ED Treatments / Results  Labs (all labs ordered are listed, but only abnormal results are displayed) Labs Reviewed  COMPREHENSIVE METABOLIC PANEL - Abnormal; Notable for the following components:      Result Value   Glucose, Bld 100 (*)    Calcium 8.8 (*)    All other components within normal limits  CBC - Abnormal; Notable for the following components:   Hemoglobin 12.8 (*)    All other components within normal limits  URINALYSIS, ROUTINE W REFLEX MICROSCOPIC - Abnormal; Notable for the following components:   Color, Urine STRAW (*)    All other components within normal limits  URINE CULTURE  CULTURE, BLOOD (ROUTINE X 2)  CULTURE, BLOOD (ROUTINE X 2)  SARS CORONAVIRUS 2 (TAT 6-24 HRS)  LIPASE, BLOOD  LACTIC ACID, PLASMA  LACTIC ACID, PLASMA  MAGNESIUM  PHOSPHORUS  TSH  COMPREHENSIVE METABOLIC PANEL  CBC    EKG None  Radiology Ct Abdomen Pelvis W Contrast  Addendum Date: 11/04/2019   ADDENDUM REPORT: 11/04/2019 18:50 ADDENDUM: These results were called by telephone at the time of interpretation on 11/04/2019 at 6:50 pm to provider [facility] , who verbally acknowledged these results. Electronically Signed   By: Zetta Bills M.D.   On: 11/04/2019 18:50   Result Date: 11/04/2019 CLINICAL DATA:  Abdominal  pain, suspected appendicitis. EXAM: CT ABDOMEN AND PELVIS WITH CONTRAST TECHNIQUE: Multidetector CT imaging of the abdomen and pelvis was performed using the standard protocol following bolus administration of intravenous contrast. CONTRAST:  126mL OMNIPAQUE IOHEXOL 300 MG/ML  SOLN COMPARISON:  None. FINDINGS: Lower chest: Is no sign of consolidation or pleural effusion. Hepatobiliary: No sign of focal hepatic lesion. Portal vein and SMV are patent. Biliary tree is normal. Pancreas: No signs of inflammation, ductal dilation or lesion. Spleen: Normal Adrenals/Urinary Tract: Normal appearance of bilateral adrenal glands. No signs of hydronephrosis. Symmetric enhancement of the kidneys. Stomach/Bowel: Stomach and small bowel are normal. There is stranding in the right lower quadrant that is centered about the ascending colon which shows severe changes of diverticular disease. There is a presumed intramural  abscess along the lateral wall of the right colon. This measures 1.7 x 1.4 by 3.2 cm and is inseparable from the thickened colonic wall. This is approximately 2-3 cm distal to the ileocecal valve. The appendix is normal. Vascular/Lymphatic: Scattered atherosclerosis. No signs of adenopathy in the abdomen or retroperitoneum. No signs of pelvic lymphadenopathy. Reproductive: Prostate normal by CT, nonspecific finding. Other: No signs of free intraperitoneal air. Musculoskeletal: Degenerative changes, marked at L5-S1. No signs of acute or destructive bone process. IMPRESSION: 1. Signs of acute right-sided diverticulitis with pericolonic/intramural abscess and adjacent stranding. 2. Marked colonic thickening may simply be related to diverticular disease; however, follow-up colonoscopy or correlation with recent colonoscopy results is suggested to exclude underlying neoplasm. 3. Atherosclerotic changes in the abdominal aorta. 4. Normal appendix. 5. A call is out to the referring provider to further discuss above findings.  Aortic Atherosclerosis (ICD10-I70.0). Electronically Signed: By: Donzetta KohutGeoffrey  Wile M.D. On: 11/04/2019 18:44    Procedures Procedures (including critical care time)  CRITICAL CARE Performed by: Canary Brimhristopher J Rakeya Glab Total critical care time: 35 minutes Critical care time was exclusive of separately billable procedures and treating other patients. Intra-abdominal abscess requiring antibiotics, surgery consultation, and admission. Critical care was necessary to treat or prevent imminent or life-threatening deterioration. Critical care was time spent personally by me on the following activities: development of treatment plan with patient and/or surrogate as well as nursing, discussions with consultants, evaluation of patient's response to treatment, examination of patient, obtaining history from patient or surrogate, ordering and performing treatments and interventions, ordering and review of laboratory studies, ordering and review of radiographic studies, pulse oximetry and re-evaluation of patient's condition.   Medications Ordered in ED Medications  acetaminophen (TYLENOL) tablet 650 mg (has no administration in time range)    Or  acetaminophen (TYLENOL) suppository 650 mg (has no administration in time range)  HYDROcodone-acetaminophen (NORCO/VICODIN) 5-325 MG per tablet 1-2 tablet (1 tablet Oral Given 11/05/19 0058)  ondansetron (ZOFRAN) tablet 4 mg (has no administration in time range)    Or  ondansetron (ZOFRAN) injection 4 mg (has no administration in time range)  0.9 %  sodium chloride infusion ( Intravenous New Bag/Given 11/05/19 0055)  HYDROmorphone (DILAUDID) injection 0.5 mg (0.5 mg Intravenous Given 11/04/19 2259)  piperacillin-tazobactam (ZOSYN) IVPB 3.375 g (has no administration in time range)  sodium chloride flush (NS) 0.9 % injection 3 mL (3 mLs Intravenous Given 11/04/19 2019)  iohexol (OMNIPAQUE) 300 MG/ML solution 100 mL (100 mLs Intravenous Contrast Given 11/04/19 1805)    piperacillin-tazobactam (ZOSYN) IVPB 3.375 g (0 g Intravenous Stopped 11/04/19 2106)     Initial Impression / Assessment and Plan / ED Course  I have reviewed the triage vital signs and the nursing notes.  Pertinent labs & imaging results that were available during my care of the patient were reviewed by me and considered in my medical decision making (see chart for details).        Chuck Hintaul H Whitelock is a 43 y.o. male with a past medical history significant for prior diverticulosis with GI bleed requiring blood transfusion several months ago who presents with severe abdominal pain.  Patient reports that for the last 4 days he has had fluctuating but constant right lower quadrant abdominal pain.  He describes the pain is primary in the right lower quadrant and also the right mid abdomen.  He denies any trauma.  He denies any history of kidney stones, appendicitis, or gallbladder disease.  He has no trauma.  He  reports he is taken over-the-counter medications without significant relief.  Denies any diarrhea but does report constipation and he also has not passed gas today.  He reports chills but no fevers.  He still has an appetite.  He denies any respiratory symptoms or chest pain or palpitations.  He describes the pain is severe at times but is currently moderate.  He does not want pain medication.  He denies other complaints.  On exam, patient has right lower quadrant and right midabdomen tenderness.  Patient had decreased bowel sounds.  He denies any groin or testicular symptoms.  No CVA tenderness or back tenderness.  Lungs clear and chest nontender.  Medically I am most concerned about appendicitis versus acute cholecystitis versus atypical diverticulitis versus constipation.  We will also rule out bowel structure.  Will get labs and CT scan.  Anticipate reassessment after work-up.  He did not want pain medicine on arrival.  7:16 PM CT scan returned showing diverticulitis with an area of  either intramural or just outside the wall abscess around 3 cm.  Radiology who I spoke with said they were unsure if this was going to be amenable to a drain or not.  I spoke with Dr. Andrey Campanile with general surgery who did not feel this was going to need emergent surgery and he recommended medical management and admission to medicine for antibiotics.  He did not feel the patient needed the extremely rapid coronavirus test as he does not need surgery tonight.  We will order the 6 to 24-hour test.  Zosyn ordered and patient be admitted for further management.   Final Clinical Impressions(s) / ED Diagnoses   Final diagnoses:  Right lower quadrant abdominal pain  Diverticulitis    ED Discharge Orders    None     Clinical Impression: 1. Right lower quadrant abdominal pain   2. Diverticulitis   3. Diverticulitis of intestine with abscess     Disposition: Admit  This note was prepared with assistance of Dragon voice recognition software. Occasional wrong-word or sound-a-like substitutions may have occurred due to the inherent limitations of voice recognition software.     Jasmarie Coppock, Canary Brim, MD 11/05/19 [phone]

## 2019-11-05 DIAGNOSIS — K5720 Diverticulitis of large intestine with perforation and abscess without bleeding: Principal | ICD-10-CM

## 2019-11-05 LAB — COMPREHENSIVE METABOLIC PANEL
ALT: 20 U/L (ref 0–44)
AST: 21 U/L (ref 15–41)
Albumin: 3.5 g/dL (ref 3.5–5.0)
Alkaline Phosphatase: 69 U/L (ref 38–126)
Anion gap: 10 (ref 5–15)
BUN: 11 mg/dL (ref 6–20)
CO2: 23 mmol/L (ref 22–32)
Calcium: 8.6 mg/dL — ABNORMAL LOW (ref 8.9–10.3)
Chloride: 105 mmol/L (ref 98–111)
Creatinine, Ser: 0.93 mg/dL (ref 0.61–1.24)
GFR calc Af Amer: >60 mL/min (ref >60)
GFR calc non Af Amer: >60 mL/min (ref >60)
Glucose, Bld: 93 mg/dL (ref 70–99)
Potassium: 3.4 mmol/L — ABNORMAL LOW (ref 3.5–5.1)
Sodium: 138 mmol/L (ref 135–145)
Total Bilirubin: 0.5 mg/dL (ref 0.3–1.2)
Total Protein: 7.2 g/dL (ref 6.5–8.1)

## 2019-11-05 LAB — PHOSPHORUS: Phosphorus: 3.1 mg/dL (ref 2.5–4.6)

## 2019-11-05 LAB — CBC
HCT: 41.2 % (ref 39.0–52.0)
Hemoglobin: 12.7 g/dL — ABNORMAL LOW (ref 13.0–17.0)
MCH: 29 pg (ref 26.0–34.0)
MCHC: 30.8 g/dL (ref 30.0–36.0)
MCV: 94.1 fL (ref 80.0–100.0)
Platelets: 213 10*3/uL (ref 150–400)
RBC: 4.38 MIL/uL (ref 4.22–5.81)
RDW: 13.9 % (ref 11.5–15.5)
WBC: 8.5 10*3/uL (ref 4.0–10.5)
nRBC: 0 % (ref 0.0–0.2)

## 2019-11-05 LAB — MAGNESIUM: Magnesium: 2 mg/dL (ref 1.7–2.4)

## 2019-11-05 LAB — TSH: TSH: 2.467 u[IU]/mL (ref 0.350–4.500)

## 2019-11-05 LAB — SARS CORONAVIRUS 2 (TAT 6-24 HRS): SARS Coronavirus 2: NEGATIVE

## 2019-11-05 MED ORDER — POTASSIUM CHLORIDE 10 MEQ/100ML IV SOLN
10.0000 meq | INTRAVENOUS | Status: AC
  Administered 2019-11-05 (×2): 10 meq via INTRAVENOUS
  Filled 2019-11-05 (×2): qty 100

## 2019-11-05 NOTE — Progress Notes (Signed)
IR consulted by Dr. Roel Cluck for possible image-guided intraabdominal abscess drain placement.  Imaging reviewed by Dr. Pascal Lux who states abscess is intramural and does not recommend drain placement. Recommends IV antibiotics and if patient worsens repeat CT in a few days. Also recommends consulting general surgery. No plans for IR intervention at this time. Will delete order. Dr. Neysa Bonito made aware.  IR available in future if needed.   Bea Graff Pessy Delamar, PA-C 11/05/2019, 8:12 AM

## 2019-11-05 NOTE — Consult Note (Addendum)
CC: RLQ discomfort x4d  Requesting provider: Dr. Dairl Ponder  HPI: KAMRYN GAUTHIER is an 43 y.o. male admitted yesterday with now 4d hx of right sided abd pain. He has had this twice before over the last 8-10 years. This episode was most severe. He reports sharp non-radiating pain in right abdomen, he was concerned he had appendicitis.  He denies radiation of pain. Nothing made it better/worse until abx and now pain improving. Denies fever/chills.  CT A/P showed intramural/pericolonic abscess with adjacent stranding on right colon/asc colon - diverticulitis. Abscess measured 1.7 x 1.4 x 3.2 cm.  He had complete colonoscopy 08/18/2019 with Dr. Elnoria Howard - diverticulosis; no evidence of neoplasia  History reviewed. No pertinent past medical history.  Past Surgical History:  Procedure Laterality Date   COLONOSCOPY Left 08/18/2019   Procedure: COLONOSCOPY;  Surgeon: Jeani Hawking, MD;  Location: [facility];  Service: Endoscopy;  Laterality: Left;    Family History  Problem Relation Age of Onset   GI Bleed Father     Social:  reports that he has never smoked. He has never used smokeless tobacco. He reports previous alcohol use. He reports current drug use. Drug: Marijuana.  Allergies: No Known Allergies  Medications: I have reviewed the patient's current medications.  Results for orders placed or performed during the hospital encounter of 11/04/19 (from the past 48 hour(s))  Lipase, blood     Status: None   Collection Time: 11/04/19  3:24 PM  Result Value Ref Range   Lipase 28 11 - 51 U/L    Comment: Performed at [facility], [address] [place], [place] [address]  Comprehensive metabolic panel     Status: Abnormal   Collection Time: 11/04/19  3:24 PM  Result Value Ref Range   Sodium 137 135 - 145 mmol/L   Potassium 3.7 3.5 - 5.1 mmol/L   Chloride 105 98 - 111 mmol/L   CO2 23 22 - 32 mmol/L   Glucose, Bld 100 (H) 70 - 99 mg/dL   BUN 14 6 - 20 mg/dL   Creatinine,  Ser 7.40 0.61 - 1.24 mg/dL   Calcium 8.8 (L) 8.9 - 10.3 mg/dL   Total Protein 7.6 6.5 - 8.1 g/dL   Albumin 3.9 3.5 - 5.0 g/dL   AST 26 15 - 41 U/L   ALT 22 0 - 44 U/L   Alkaline Phosphatase 75 38 - 126 U/L   Total Bilirubin 0.4 0.3 - 1.2 mg/dL   GFR calc non Af Amer >60 >60 mL/min   GFR calc Af Amer >60 >60 mL/min   Anion gap 9 5 - 15    Comment: Performed at [facility], [address], [place], [place] [address]  CBC     Status: Abnormal   Collection Time: 11/04/19  3:24 PM  Result Value Ref Range   WBC 9.3 4.0 - 10.5 K/uL   RBC 4.25 4.22 - 5.81 MIL/uL   Hemoglobin 12.8 (L) 13.0 - 17.0 g/dL   HCT 18.5 63.1 - 49.7 %   MCV 95.5 80.0 - 100.0 fL   MCH 30.1 26.0 - 34.0 pg   MCHC 31.5 30.0 - 36.0 g/dL   RDW 02.6 37.8 - 58.8 %   Platelets 217 150 - 400 K/uL   nRBC 0.0 0.0 - 0.2 %    Comment: Performed at [facility], [address], [place], [place] [address]  Urinalysis, Routine w reflex microscopic     Status: Abnormal  Collection Time: 11/04/19  5:57 PM  Result Value Ref Range   Color, Urine STRAW (A) YELLOW   APPearance CLEAR CLEAR   Specific Gravity, Urine 1.010 1.005 - 1.030   pH 5.0 5.0 - 8.0   Glucose, UA NEGATIVE NEGATIVE mg/dL   Hgb urine dipstick NEGATIVE NEGATIVE   Bilirubin Urine NEGATIVE NEGATIVE   Ketones, ur NEGATIVE NEGATIVE mg/dL   Protein, ur NEGATIVE NEGATIVE mg/dL   Nitrite NEGATIVE NEGATIVE   Leukocytes,Ua NEGATIVE NEGATIVE    Comment: Performed at [facility], [address], [place], [place] [address]  Lactic acid, plasma     Status: None   Collection Time: 11/04/19  7:10 PM  Result Value Ref Range   Lactic Acid, Venous 0.9 0.5 - 1.9 mmol/L    Comment: Performed at [facility], [address] [place], [place] [address]  SARS CORONAVIRUS 2 (TAT 6-24 HRS) Nasopharyngeal Nasopharyngeal Swab     Status: None   Collection Time: 11/04/19  8:22 PM   Specimen: Nasopharyngeal  Swab  Result Value Ref Range   SARS Coronavirus 2 NEGATIVE NEGATIVE    Comment: (NOTE) SARS-CoV-2 target nucleic acids are NOT DETECTED. The SARS-CoV-2 RNA is generally detectable in upper and lower respiratory specimens during the acute phase of infection. Negative results do not preclude SARS-CoV-2 infection, do not rule out co-infections with other pathogens, and should not be used as the sole basis for treatment or other patient management decisions. Negative results must be combined with clinical observations, patient history, and epidemiological information. The expected result is Negative. Fact Sheet for Patients: HairSlick.no Fact Sheet for Healthcare Providers: quierodirigir.com This test is not yet approved or cleared by the [place] FDA and  has been authorized for detection and/or diagnosis of SARS-CoV-2 by FDA under an Emergency Use Authorization (EUA). This EUA will remain  in effect (meaning this test can be used) for the duration of the COVID-19 declaration under Section 56 4(b)(1) of the Act, 21 U.S.C. section 360bbb-3(b)(1), unless the authorization is terminated or revoked sooner. Performed at [facility], [address], [place], [place] [address]   Lactic acid, plasma     Status: None   Collection Time: 11/04/19 10:23 PM  Result Value Ref Range   Lactic Acid, Venous 1.1 0.5 - 1.9 mmol/L    Comment: Performed at [facility], [address] [place], [place] [address]  Magnesium     Status: None   Collection Time: 11/05/19  3:34 AM  Result Value Ref Range   Magnesium 2.0 1.7 - 2.4 mg/dL    Comment: Performed at [facility], [address] [place], [place] [address]  Phosphorus     Status: None   Collection Time: 11/05/19  3:34 AM  Result Value Ref Range   Phosphorus 3.1 2.5 - 4.6 mg/dL    Comment: Performed at [facility], [address], [place], [place] [address]  TSH     Status: None   Collection Time: 11/05/19  3:34 AM  Result Value Ref Range   TSH 2.467 0.350 - 4.500 uIU/mL    Comment: Performed by a 3rd Generation assay with a functional sensitivity of <=0.01 uIU/mL. Performed at [facility], [address], [place], [place] [address]   Comprehensive metabolic panel     Status: Abnormal   Collection Time: 11/05/19  3:34 AM  Result Value Ref Range   Sodium 138 135 - 145 mmol/L  Potassium 3.4 (L) 3.5 - 5.1 mmol/L   Chloride 105 98 - 111 mmol/L   CO2 23 22 - 32 mmol/L   Glucose, Bld 93 70 - 99 mg/dL   BUN 11 6 - 20 mg/dL   Creatinine, Ser 1.610.93 0.61 - 1.24 mg/dL   Calcium 8.6 (L) 8.9 - 10.3 mg/dL   Total Protein 7.2 6.5 - 8.1 g/dL   Albumin 3.5 3.5 - 5.0 g/dL   AST 21 15 - 41 U/L   ALT 20 0 - 44 U/L   Alkaline Phosphatase 69 38 - 126 U/L   Total Bilirubin 0.5 0.3 - 1.2 mg/dL   GFR calc non Af Amer >60 >60 mL/min   GFR calc Af Amer >60 >60 mL/min   Anion gap 10 5 - 15    Comment: Performed at [facility], [address] [place], [place] [address]  CBC     Status: Abnormal   Collection Time: 11/05/19  3:34 AM  Result Value Ref Range   WBC 8.5 4.0 - 10.5 K/uL   RBC 4.38 4.22 - 5.81 MIL/uL   Hemoglobin 12.7 (L) 13.0 - 17.0 g/dL   HCT 54.041.2 98.139.0 - 19.152.0 %   MCV 94.1 80.0 - 100.0 fL   MCH 29.0 26.0 - 34.0 pg   MCHC 30.8 30.0 - 36.0 g/dL   RDW 47.813.9 29.511.5 - 62.115.5 %   Platelets 213 150 - 400 K/uL   nRBC 0.0 0.0 - 0.2 %    Comment: Performed at [facility], [address] [place], [place] [address]    Ct Abdomen Pelvis W Contrast  Addendum Date: 11/04/2019   ADDENDUM REPORT: 11/04/2019 18:50 ADDENDUM: These results were called by telephone at the time of interpretation on 11/04/2019 at 6:50 pm to provider [facility] , who verbally acknowledged these results. Electronically Signed   By: Donzetta KohutGeoffrey  Wile M.D.   On: 11/04/2019 18:50    Result Date: 11/04/2019 CLINICAL DATA:  Abdominal pain, suspected appendicitis. EXAM: CT ABDOMEN AND PELVIS WITH CONTRAST TECHNIQUE: Multidetector CT imaging of the abdomen and pelvis was performed using the standard protocol following bolus administration of intravenous contrast. CONTRAST:  100mL OMNIPAQUE IOHEXOL 300 MG/ML  SOLN COMPARISON:  None. FINDINGS: Lower chest: Is no sign of consolidation or pleural effusion. Hepatobiliary: No sign of focal hepatic lesion. Portal vein and SMV are patent. Biliary tree is normal. Pancreas: No signs of inflammation, ductal dilation or lesion. Spleen: Normal Adrenals/Urinary Tract: Normal appearance of bilateral adrenal glands. No signs of hydronephrosis. Symmetric enhancement of the kidneys. Stomach/Bowel: Stomach and small bowel are normal. There is stranding in the right lower quadrant that is centered about the ascending colon which shows severe changes of diverticular disease. There is a presumed intramural abscess along the lateral wall of the right colon. This measures 1.7 x 1.4 by 3.2 cm and is inseparable from the thickened colonic wall. This is approximately 2-3 cm distal to the ileocecal valve. The appendix is normal. Vascular/Lymphatic: Scattered atherosclerosis. No signs of adenopathy in the abdomen or retroperitoneum. No signs of pelvic lymphadenopathy. Reproductive: Prostate normal by CT, nonspecific finding. Other: No signs of free intraperitoneal air. Musculoskeletal: Degenerative changes, marked at L5-S1. No signs of acute or destructive bone process. IMPRESSION: 1. Signs of acute right-sided diverticulitis with pericolonic/intramural abscess and adjacent stranding. 2. Marked colonic thickening may simply be related to diverticular disease; however, follow-up colonoscopy or correlation with recent colonoscopy results is suggested to exclude underlying neoplasm. 3. Atherosclerotic changes  in the abdominal aorta. 4. Normal appendix. 5. A call is out to  the referring provider to further discuss above findings. Aortic Atherosclerosis (ICD10-I70.0). Electronically Signed: By: Donzetta Kohut M.D. On: 11/04/2019 18:44    ROS - all of the below systems have been reviewed with the patient and positives are indicated with bold text General: chills, fever or night sweats Eyes: blurry vision or double vision ENT: epistaxis or sore throat Allergy/Immunology: itchy/watery eyes or nasal congestion Hematologic/Lymphatic: bleeding problems, blood clots or swollen lymph nodes Endocrine: temperature intolerance or unexpected weight changes Breast: new or changing breast lumps or nipple discharge Resp: cough, shortness of breath, or wheezing CV: chest pain or dyspnea on exertion GI: as per HPI GU: dysuria, trouble voiding, or hematuria MSK: joint pain or joint stiffness Neuro: TIA or stroke symptoms Derm: pruritus and skin lesion changes Psych: anxiety and depression  PE Blood pressure 136/82, pulse 71, temperature 98.3 F (36.8 C), temperature source Oral, resp. rate 18, height  (1.905 m), weight 80.3 kg, SpO2 100 %. Constitutional: NAD; conversant; no deformities Eyes: Moist conjunctiva; no lid lag; anicteric; pupils are equal Neck: Trachea midline; no thyromegaly Lungs: Normal respiratory effort; no tactile fremitus CV: RRR; no palpable thrills; no pitting edema GI: Abd soft, moderate tenderness along RUQ/RLQ; no tenderness elsewhere; no rebound nor guarding; non-distended; no palpable hepatosplenomegaly MSK: Normal gait; no clubbing/cyanosis Psychiatric: Appropriate affect; alert and oriented x3 Lymphatic: No palpable cervical or axillary lymphadenopathy  Results for orders placed or performed during the hospital encounter of 11/04/19 (from the past 48 hour(s))  Lipase, blood     Status: None   Collection Time: 11/04/19  3:24 PM  Result Value Ref Range   Lipase 28 11 - 51 U/L    Comment: Performed at [facility],  [address] [place], [place] [address]  Comprehensive metabolic panel     Status: Abnormal   Collection Time: 11/04/19  3:24 PM  Result Value Ref Range   Sodium 137 135 - 145 mmol/L   Potassium 3.7 3.5 - 5.1 mmol/L   Chloride 105 98 - 111 mmol/L   CO2 23 22 - 32 mmol/L   Glucose, Bld 100 (H) 70 - 99 mg/dL   BUN 14 6 - 20 mg/dL   Creatinine, Ser 8.29 0.61 - 1.24 mg/dL   Calcium 8.8 (L) 8.9 - 10.3 mg/dL   Total Protein 7.6 6.5 - 8.1 g/dL   Albumin 3.9 3.5 - 5.0 g/dL   AST 26 15 - 41 U/L   ALT 22 0 - 44 U/L   Alkaline Phosphatase 75 38 - 126 U/L   Total Bilirubin 0.4 0.3 - 1.2 mg/dL   GFR calc non Af Amer >60 >60 mL/min   GFR calc Af Amer >60 >60 mL/min   Anion gap 9 5 - 15    Comment: Performed at [facility], [address], [place], [place] [address]  CBC     Status: Abnormal   Collection Time: 11/04/19  3:24 PM  Result Value Ref Range   WBC 9.3 4.0 - 10.5 K/uL   RBC 4.25 4.22 - 5.81 MIL/uL   Hemoglobin 12.8 (L) 13.0 - 17.0 g/dL   HCT 08.6 57.8 - 46.9 %   MCV 95.5 80.0 - 100.0 fL   MCH 30.1 26.0 - 34.0 pg   MCHC 31.5 30.0 - 36.0 g/dL   RDW 62.9 52.8 - 41.3 %   Platelets 217 150 - 400 K/uL  nRBC 0.0 0.0 - 0.2 %    Comment: Performed at [facility] [place] [address] [place], [place] [address]  Urinalysis, Routine w reflex microscopic     Status: Abnormal   Collection Time: 11/04/19  5:57 PM  Result Value Ref Range   Color, Urine STRAW (A) YELLOW   APPearance CLEAR CLEAR   Specific Gravity, Urine 1.010 1.005 - 1.030   pH 5.0 5.0 - 8.0   Glucose, UA NEGATIVE NEGATIVE mg/dL   Hgb urine dipstick NEGATIVE NEGATIVE   Bilirubin Urine NEGATIVE NEGATIVE   Ketones, ur NEGATIVE NEGATIVE mg/dL   Protein, ur NEGATIVE NEGATIVE mg/dL   Nitrite NEGATIVE NEGATIVE   Leukocytes,Ua NEGATIVE NEGATIVE    Comment: Performed at [place] [address], [place], [place] [address]  Lactic acid, plasma     Status: None    Collection Time: 11/04/19  7:10 PM  Result Value Ref Range   Lactic Acid, Venous 0.9 0.5 - 1.9 mmol/L    Comment: Performed at [facility], [place] [address], [place], [place] [address]  SARS CORONAVIRUS 2 (TAT 6-24 HRS) Nasopharyngeal Nasopharyngeal Swab     Status: None   Collection Time: 11/04/19  8:22 PM   Specimen: Nasopharyngeal Swab  Result Value Ref Range   SARS Coronavirus 2 NEGATIVE NEGATIVE    Comment: (NOTE) SARS-CoV-2 target nucleic acids are NOT DETECTED. The SARS-CoV-2 RNA is generally detectable in upper and lower respiratory specimens during the acute phase of infection. Negative results do not preclude SARS-CoV-2 infection, do not rule out co-infections with other pathogens, and should not be used as the sole basis for treatment or other patient management decisions. Negative results must be combined with clinical observations, patient history, and epidemiological information. The expected result is Negative. Fact Sheet for Patients: SugarRoll.be Fact Sheet for Healthcare Providers: https://www.woods-mathews.com/ This test is not yet approved or cleared by the [place] FDA and  has been authorized for detection and/or diagnosis of SARS-CoV-2 by FDA under an Emergency Use Authorization (EUA). This EUA will remain  in effect (meaning this test can be used) for the duration of the COVID-19 declaration under Section 56 4(b)(1) of the Act, 21 U.S.C. section 360bbb-3(b)(1), unless the authorization is terminated or revoked sooner. Performed at [facility], [place] [address], [place], [place] [address]   Lactic acid, plasma     Status: None   Collection Time: 11/04/19 10:23 PM  Result Value Ref Range   Lactic Acid, Venous 1.1 0.5 - 1.9 mmol/L    Comment: Performed at [facility], [place] [address] [place], [place] [address]  Magnesium     Status: None   Collection Time: 11/05/19   3:34 AM  Result Value Ref Range   Magnesium 2.0 1.7 - 2.4 mg/dL    Comment: Performed at [facility], [place] [address] [place], [place] [address]  Phosphorus     Status: None   Collection Time: 11/05/19  3:34 AM  Result Value Ref Range   Phosphorus 3.1 2.5 - 4.6 mg/dL    Comment: Performed at [facility], [place] [address], [place], [place] [address]  TSH     Status: None   Collection Time: 11/05/19  3:34 AM  Result Value Ref Range   TSH 2.467 0.350 - 4.500 uIU/mL    Comment: Performed by a 3rd Generation assay with a functional sensitivity of <=0.01 uIU/mL. Performed at [facility], [place]  [address] [place], [place] [address]   Comprehensive metabolic panel     Status: Abnormal   Collection Time: 11/05/19  3:34 AM  Result Value Ref Range   Sodium 138 135 - 145 mmol/L   Potassium 3.4 (L) 3.5 - 5.1 mmol/L   Chloride 105 98 - 111 mmol/L   CO2 23 22 - 32 mmol/L   Glucose, Bld 93 70 - 99 mg/dL   BUN 11 6 - 20 mg/dL   Creatinine, Ser 4.69 0.61 - 1.24 mg/dL   Calcium 8.6 (L) 8.9 - 10.3 mg/dL   Total Protein 7.2 6.5 - 8.1 g/dL   Albumin 3.5 3.5 - 5.0 g/dL   AST 21 15 - 41 U/L   ALT 20 0 - 44 U/L   Alkaline Phosphatase 69 38 - 126 U/L   Total Bilirubin 0.5 0.3 - 1.2 mg/dL   GFR calc non Af Amer >60 >60 mL/min   GFR calc Af Amer >60 >60 mL/min   Anion gap 10 5 - 15    Comment: Performed at [facility], [address] [place], [place] [address]  CBC     Status: Abnormal   Collection Time: 11/05/19  3:34 AM  Result Value Ref Range   WBC 8.5 4.0 - 10.5 K/uL   RBC 4.38 4.22 - 5.81 MIL/uL   Hemoglobin 12.7 (L) 13.0 - 17.0 g/dL   HCT 84.1 32.4 - 40.1 %   MCV 94.1 80.0 - 100.0 fL   MCH 29.0 26.0 - 34.0 pg   MCHC 30.8 30.0 - 36.0 g/dL   RDW 02.7 25.3 - 66.4 %   Platelets 213 150 - 400 K/uL   nRBC 0.0 0.0 - 0.2 %    Comment: Performed at [facility], [address], [place], [place]  [address]    Ct Abdomen Pelvis W Contrast  Addendum Date: 11/04/2019   ADDENDUM REPORT: 11/04/2019 18:50 ADDENDUM: These results were called by telephone at the time of interpretation on 11/04/2019 at 6:50 pm to provider [facility] , who verbally acknowledged these results. Electronically Signed   By: Donzetta Kohut M.D.   On: 11/04/2019 18:50   Result Date: 11/04/2019 CLINICAL DATA:  Abdominal pain, suspected appendicitis. EXAM: CT ABDOMEN AND PELVIS WITH CONTRAST TECHNIQUE: Multidetector CT imaging of the abdomen and pelvis was performed using the standard protocol following bolus administration of intravenous contrast. CONTRAST:  OMNIPAQUE IOHEXOL 300 MG/ML  SOLN COMPARISON:  None. FINDINGS: Lower chest: Is no sign of consolidation or pleural effusion. Hepatobiliary: No sign of focal hepatic lesion. Portal vein and SMV are patent. Biliary tree is normal. Pancreas: No signs of inflammation, ductal dilation or lesion. Spleen: Normal Adrenals/Urinary Tract: Normal appearance of bilateral adrenal glands. No signs of hydronephrosis. Symmetric enhancement of the kidneys. Stomach/Bowel: Stomach and small bowel are normal. There is stranding in the right lower quadrant that is centered about the ascending colon which shows severe changes of diverticular disease. There is a presumed intramural abscess along the lateral wall of the right colon. This measures 1.7 x 1.4 by 3.2 cm and is inseparable from the thickened colonic wall. This is approximately 2-3 cm distal to the ileocecal valve. The appendix is normal. Vascular/Lymphatic: Scattered atherosclerosis. No signs of adenopathy in the abdomen or retroperitoneum. No signs of pelvic lymphadenopathy. Reproductive: Prostate normal by CT, nonspecific finding. Other: No signs of free intraperitoneal air. Musculoskeletal: Degenerative changes, marked at L5-S1. No signs of acute or destructive bone process. IMPRESSION: 1. Signs  of acute right-sided  diverticulitis with pericolonic/intramural abscess and adjacent stranding. 2. Marked colonic thickening may simply be related to diverticular disease; however, follow-up colonoscopy or correlation with recent colonoscopy results is suggested to exclude underlying neoplasm. 3. Atherosclerotic changes in the abdominal aorta. 4. Normal appendix. 5. A call is out to the referring provider to further discuss above findings. Aortic Atherosclerosis (ICD10-I70.0). Electronically Signed: By: Donzetta Kohut M.D. On: 11/04/2019 18:44   A/P: TOREZ BEAUREGARD is an 43 y.o. male with diverticulitis of ascending colon and associated abscess which may be intramural  -Ok for clear liquids at this point -MIVF -IV antibiotics, Zosyn - abscess small enough that should respond to abx without perc drain placement -Monitor; agree with avoiding perc drain placement at this time given potential for intramural abscess and therefore possible colon perforation with drain placement -We discussed the relevant anatomy and physiology as well as pathophysiology of diverticulitis. We reviewed treatment plan and answered his questions to his satisfaction; he expressed agreement with the plan moving forward  Stephanie Coup. Cliffton Asters, M.D. [facility] Use [email or web address] to contact on call provider

## 2019-11-05 NOTE — Progress Notes (Signed)
PROGRESS NOTE    Peter Armstrong    Code Status: Full Code  ZOX:[id number] DOB: 11/22/1976 DOA: 11/04/2019  PCP: Peter Armstrong, No Pcp Per    Hospital Summary  43 year old male past medical history of diverticulosis on colonoscopy recent GI bleed requiring blood transfusion August 2020 presented to the ED on 11/13 with right lower quadrant abdominal pain x3 days sharp in nature without fever, nausea, vomiting or diarrhea.  Negative Covid in ED.  CT abdomen pelvis with sided diverticulitis and pericolonic/intramural abscess and adjacent stranding.  Started on Zosyn.  IR consulted however abscesses not amenable to IR drainage.  General surgery consulted and recommended conservative management with antibiotics.  A & P   Active Problems:   Diverticulitis of intestine with abscess   Diverticulitis of ascending colon and associated intramural/pericolonic abscess afebrile, hemodynamically stable without leukocytosis.  Completed 1 day Zosyn.  Abscess not amenable to IR drainage.  Family history noncontributory -General surgery consulted -Continue Zosyn -Continue pain control -Advance diet as tolerated  Hypokalemia pleated  Iron deficiency anemia stable   DVT prophylaxis: SCDs Diet: Clear liquid diet, advance as tolerated Family Communication: No family at bedside Disposition Plan: Likely DC in 1 to 2 days pending pain tolerance and diet advancement  Consultants  IR General surgery  Procedures  None  Antibiotics  Zosyn 11/13->      Subjective   Peter Armstrong seen and examined at bedside in no acute distress and resting comfortably. No acute events overnight.  States his pain is relatively well tolerated with current pain regimen but occasionally has some abdominal soreness.  Was n.p.o. when I saw him this a.m. but admitted that he had an appetite.  Denies any family history of any GI cancers.  No nausea, vomiting diarrhea.  Remaining 10 point ROS negative  Objective   Vitals:   11/05/19 0629 11/05/19 0943 11/05/19 1311 11/05/19 1732  BP: 121/77 136/82 129/77 126/81  Pulse: 61 71 65 (!) 59  Resp: 18 18 18 18   Temp: 98.1 F (36.7 C) 98.3 F (36.8 C) 98.5 F (36.9 C) 98.4 F (36.9 C)  TempSrc: Oral Oral Oral Oral  SpO2: 100% 100% 100% 100%  Weight:      Height:        Intake/Output Summary (Last 24 hours) at 11/05/2019 1837 Last data filed at 11/05/2019 1749 Gross per 24 hour  Intake 1330.8 ml  Output 2 ml  Net 1328.8 ml   Filed Weights   11/04/19 1423  Weight: 80.3 kg    Examination:  Physical Exam Vitals signs and nursing note reviewed.  Constitutional:      Appearance: Normal appearance.  HENT:     Head: Normocephalic and atraumatic.     Nose: Nose normal.  Eyes:     Extraocular Movements: Extraocular movements intact.  Neck:     Musculoskeletal: Normal range of motion. No neck rigidity.  Cardiovascular:     Rate and Rhythm: Normal rate and regular rhythm.  Pulmonary:     Effort: Pulmonary effort is normal.     Breath sounds: Normal breath sounds.  Abdominal:     General: Abdomen is flat.     Palpations: Abdomen is soft.     Tenderness: There is abdominal tenderness in the right lower quadrant.     Comments: Mild abdominal tenderness  Musculoskeletal: Normal range of motion.        General: No swelling.  Neurological:     Mental Status: He is alert. Mental status is at baseline.  Psychiatric:        Mood and Affect: Mood normal.        Behavior: Behavior normal.     Data Reviewed: I have personally reviewed following labs and imaging studies  CBC: Recent Labs  Lab 11/04/19 1524 11/05/19 0334  WBC 9.3 8.5  HGB 12.8* 12.7*  HCT 40.6 41.2  MCV 95.5 94.1  PLT 217 119   Basic Metabolic Panel: Recent Labs  Lab 11/04/19 1524 11/05/19 0334  NA 137 138  K 3.7 3.4*  CL 105 105  CO2 23 23  GLUCOSE 100* 93  BUN 14 11  CREATININE 0.92 0.93  CALCIUM 8.8* 8.6*  MG  --  2.0  PHOS  --  3.1   GFR: Estimated Creatinine  Clearance: 116.3 mL/min (by C-G formula based on SCr of 0.93 mg/dL). Liver Function Tests: Recent Labs  Lab 11/04/19 1524 11/05/19 0334  AST 26 21  ALT 22 20  ALKPHOS 75 69  BILITOT 0.4 0.5  PROT 7.6 7.2  ALBUMIN 3.9 3.5   Recent Labs  Lab 11/04/19 1524  LIPASE 28   No results for input(s): AMMONIA in the last 168 hours. Coagulation Profile: No results for input(s): INR, PROTIME in the last 168 hours. Cardiac Enzymes: No results for input(s): CKTOTAL, CKMB, CKMBINDEX, TROPONINI in the last 168 hours. BNP (last 3 results) No results for input(s): PROBNP in the last 8760 hours. HbA1C: No results for input(s): HGBA1C in the last 72 hours. CBG: No results for input(s): GLUCAP in the last 168 hours. Lipid Profile: No results for input(s): CHOL, HDL, LDLCALC, TRIG, CHOLHDL, LDLDIRECT in the last 72 hours. Thyroid Function Tests: Recent Labs    11/05/19 0334  TSH 2.467   Anemia Panel: No results for input(s): VITAMINB12, FOLATE, FERRITIN, TIBC, IRON, RETICCTPCT in the last 72 hours. Sepsis Labs: Recent Labs  Lab 11/04/19 1910 11/04/19 2223  LATICACIDVEN 0.9 1.1    Recent Results (from the past 240 hour(s))  Blood culture (routine x 2)     Status: None (Preliminary result)   Collection Time: 11/04/19  7:10 PM   Specimen: BLOOD  Result Value Ref Range Status   Specimen Description   Final    BLOOD BLOOD LEFT HAND Performed at [facility], [place] [address] [place], [place] [address]    Special Requests   Final    BOTTLES DRAWN AEROBIC AND ANAEROBIC Blood Culture adequate volume Performed at [facility] [address], [place], [place] [address]    Culture   Final    NO GROWTH < 24 HOURS Performed at [facility] [address], [place], [place] [address]    Report Status PENDING  Incomplete  SARS CORONAVIRUS 2 (TAT 6-24 HRS) Nasopharyngeal Nasopharyngeal Swab     Status: None   Collection Time: 11/04/19  8:22 PM    Specimen: Nasopharyngeal Swab  Result Value Ref Range Status   SARS Coronavirus 2 NEGATIVE NEGATIVE Final    Comment: (NOTE) SARS-CoV-2 target nucleic acids are NOT DETECTED. The SARS-CoV-2 RNA is generally detectable in upper and lower respiratory specimens during the acute phase of infection. Negative results do not preclude SARS-CoV-2 infection, do not rule out co-infections with other pathogens, and should not be used as the sole basis for treatment or other Peter Armstrong management decisions. Negative results must be combined with clinical observations, Peter Armstrong history, and epidemiological information. The expected result is Negative. Fact Sheet for Patients: SugarRoll.be Fact Sheet for Healthcare Providers: https://www.woods-mathews.com/  This test is not yet approved or cleared by the [place] and  has been authorized for detection and/or diagnosis of SARS-CoV-2 by FDA under an Emergency Use Authorization (EUA). This EUA will remain  in effect (meaning this test can be used) for the duration of the COVID-19 declaration under Section 56 4(b)(1) of the Act, 21 U.S.C. section 360bbb-3(b)(1), unless the authorization is terminated or revoked sooner. Performed at [facility], [address] [place], [place] [address]   Blood culture (routine x 2)     Status: None (Preliminary result)   Collection Time: 11/04/19 10:23 PM   Specimen: BLOOD  Result Value Ref Range Status   Specimen Description   Final    BLOOD RIGHT ANTECUBITAL Performed at [facility], [address], [place], [place] [address]    Special Requests   Final    BOTTLES DRAWN AEROBIC AND ANAEROBIC Blood Culture adequate volume Performed at [facility], [address], [place], [place] [address]    Culture   Final    NO GROWTH < 12 HOURS Performed at [facility], [address] [place], [place] [address]    Report  Status PENDING  Incomplete         Radiology Studies: Ct Abdomen Pelvis W Contrast  Addendum Date: 11/04/2019   ADDENDUM REPORT: 11/04/2019 18:50 ADDENDUM: These results were called by telephone at the time of interpretation on 11/04/2019 at 6:50 pm to provider [facility] , who verbally acknowledged these results. Electronically Signed   By: Donzetta Kohut M.D.   On: 11/04/2019 18:50   Result Date: 11/04/2019 CLINICAL DATA:  Abdominal pain, suspected appendicitis. EXAM: CT ABDOMEN AND PELVIS WITH CONTRAST TECHNIQUE: Multidetector CT imaging of the abdomen and pelvis was performed using the standard protocol following bolus administration of intravenous contrast. CONTRAST:  OMNIPAQUE IOHEXOL 300 MG/ML  SOLN COMPARISON:  None. FINDINGS: Lower chest: Is no sign of consolidation or pleural effusion. Hepatobiliary: No sign of focal hepatic lesion. Portal vein and SMV are patent. Biliary tree is normal. Pancreas: No signs of inflammation, ductal dilation or lesion. Spleen: Normal Adrenals/Urinary Tract: Normal appearance of bilateral adrenal glands. No signs of hydronephrosis. Symmetric enhancement of the kidneys. Stomach/Bowel: Stomach and small bowel are normal. There is stranding in the right lower quadrant that is centered about the ascending colon which shows severe changes of diverticular disease. There is a presumed intramural abscess along the lateral wall of the right colon. This measures 1.7 x 1.4 by 3.2 cm and is inseparable from the thickened colonic wall. This is approximately 2-3 cm distal to the ileocecal valve. The appendix is normal. Vascular/Lymphatic: Scattered atherosclerosis. No signs of adenopathy in the abdomen or retroperitoneum. No signs of pelvic lymphadenopathy. Reproductive: Prostate normal by CT, nonspecific finding. Other: No signs of free intraperitoneal air. Musculoskeletal: Degenerative changes, marked at L5-S1. No signs of acute or destructive bone process.  IMPRESSION: 1. Signs of acute right-sided diverticulitis with pericolonic/intramural abscess and adjacent stranding. 2. Marked colonic thickening may simply be related to diverticular disease; however, follow-up colonoscopy or correlation with recent colonoscopy results is suggested to exclude underlying neoplasm. 3. Atherosclerotic changes in the abdominal aorta. 4. Normal appendix. 5. A call is out to the referring provider to further discuss above findings. Aortic Atherosclerosis (ICD10-I70.0). Electronically Signed: By: Donzetta Kohut M.D. On: 11/04/2019 18:44        Scheduled Meds: Continuous Infusions:  piperacillin-tazobactam (ZOSYN)  IV 3.375 g (11/05/19  1123)     LOS: 1 day    Time spent: 25 minutes with over 50% of the time coordinating the Peter Armstrong's care    Jae Dire, DO [facility] Pager [phone]  If 7PM-7AM, please contact night-coverage [email or web address] Password [facility] 11/05/2019, 6:37 PM

## 2019-11-06 DIAGNOSIS — E876 Hypokalemia: Secondary | ICD-10-CM

## 2019-11-06 LAB — CBC
HCT: 43.9 % (ref 39.0–52.0)
Hemoglobin: 13.5 g/dL (ref 13.0–17.0)
MCH: 29.2 pg (ref 26.0–34.0)
MCHC: 30.8 g/dL (ref 30.0–36.0)
MCV: 94.8 fL (ref 80.0–100.0)
Platelets: 232 10*3/uL (ref 150–400)
RBC: 4.63 MIL/uL (ref 4.22–5.81)
RDW: 13.9 % (ref 11.5–15.5)
WBC: 7.4 10*3/uL (ref 4.0–10.5)
nRBC: 0 % (ref 0.0–0.2)

## 2019-11-06 LAB — BASIC METABOLIC PANEL
Anion gap: 10 (ref 5–15)
BUN: 8 mg/dL (ref 6–20)
CO2: 24 mmol/L (ref 22–32)
Calcium: 8.7 mg/dL — ABNORMAL LOW (ref 8.9–10.3)
Chloride: 104 mmol/L (ref 98–111)
Creatinine, Ser: 0.99 mg/dL (ref 0.61–1.24)
GFR calc Af Amer: >60 mL/min (ref >60)
GFR calc non Af Amer: >60 mL/min (ref >60)
Glucose, Bld: 91 mg/dL (ref 70–99)
Potassium: 3.4 mmol/L — ABNORMAL LOW (ref 3.5–5.1)
Sodium: 138 mmol/L (ref 135–145)

## 2019-11-06 LAB — URINE CULTURE: Culture: NO GROWTH

## 2019-11-06 MED ORDER — POTASSIUM CHLORIDE CRYS ER 20 MEQ PO TBCR
20.0000 meq | EXTENDED_RELEASE_TABLET | Freq: Once | ORAL | Status: AC
  Administered 2019-11-06: 08:00:00 20 meq via ORAL
  Filled 2019-11-06: qty 1

## 2019-11-06 NOTE — Plan of Care (Signed)
  Problem: Education: Goal: Knowledge of General Education information will improve Description: Including pain rating scale, medication(s)/side effects and non-pharmacologic comfort measures Outcome: Progressing   Problem: Health Behavior/Discharge Planning: Goal: Ability to manage health-related needs will improve Outcome: Progressing   Problem: Clinical Measurements: Goal: Will remain free from infection Outcome: Progressing Goal: Diagnostic test results will improve Outcome: Progressing Goal: Cardiovascular complication will be avoided Outcome: Progressing   Problem: Activity: Goal: Risk for activity intolerance will decrease Outcome: Progressing   Problem: Nutrition: Goal: Adequate nutrition will be maintained Outcome: Progressing   Problem: Coping: Goal: Level of anxiety will decrease Outcome: Progressing   Problem: Pain Managment: Goal: General experience of comfort will improve Outcome: Progressing

## 2019-11-06 NOTE — Progress Notes (Signed)
Subjective No acute events. Feeling much better. Denies n/v.  Objective: Vital signs in last 24 hours: Temp:  [98.1 F (36.7 C)-98.5 F (36.9 C)] 98.2 F (36.8 C) (11/15 0602) Pulse Rate:  [55-71] 55 (11/15 0602) Resp:  [18-20] 20 (11/15 0602) BP: (123-136)/(71-82) 123/76 (11/15 0602) SpO2:  [100 %] 100 % (11/15 0602) Last BM Date: 11/04/19  Intake/Output from previous day: 11/14 0701 - 11/15 0700 In: 1020 [P.O.:720; IV Piggyback:300] Out: 400 [Urine:400] Intake/Output this shift: No intake/output data recorded.  Gen: NAD, comfortable CV: RRR Pulm: Normal work of breathing Abd: Soft, minimal right sided tenderness; nondistended Ext: SCDs in place  Lab Results: CBC  Recent Labs    11/05/19 0334 11/06/19 0337  WBC 8.5 7.4  HGB 12.7* 13.5  HCT 41.2 43.9  PLT 213 232   BMET Recent Labs    11/05/19 0334 11/06/19 0337  NA 138 138  K 3.4* 3.4*  CL 105 104  CO2 23 24  GLUCOSE 93 91  BUN 11 8  CREATININE 0.93 0.99  CALCIUM 8.6* 8.7*   PT/INR No results for input(s): LABPROT, INR in the last 72 hours. ABG No results for input(s): PHART, HCO3 in the last 72 hours.  Invalid input(s): PCO2, PO2  Studies/Results:  Anti-infectives: Anti-infectives (From admission, onward)   Start     Dose/Rate Route Frequency Ordered Stop   11/05/19 0400  piperacillin-tazobactam (ZOSYN) IVPB 3.375 g     3.375 g 12.5 mL/hr over 240 Minutes Intravenous Every 8 hours 11/04/19 2037     11/04/19 1915  piperacillin-tazobactam (ZOSYN) IVPB 3.375 g     3.375 g 100 mL/hr over 30 Minutes Intravenous  Once 11/04/19 1909 11/04/19 2106       Assessment/Plan: Patient Active Problem List   Diagnosis Date Noted  . Diverticulitis of intestine with abscess 11/04/2019  . Lower GI bleed   . Orthostatic hypotension 08/17/2019  . Painless rectal bleeding 08/16/2019  . Alcohol use 08/16/2019  Peter Armstrong is an 43 y.o. male with diverticulitis of ascending colon and associated abscess  which may be intramural  -Advance to soft diet -Continue IV abx today -Repeat CBC in AM   LOS: 2 days   Sharon Mt. Dema Severin, M.D. [facility] Use [email or web address] to contact on call provider

## 2019-11-06 NOTE — Progress Notes (Signed)
PROGRESS NOTE    Peter Armstrong    Code Status: Full Code  HKV:[id number] DOB: 07-29-1976 DOA: 11/04/2019  PCP: Patient, No Pcp Per    Hospital Summary  43 year old male past medical history of diverticulosis on colonoscopy recent GI bleed requiring blood transfusion August 2020 presented to the ED on 11/13 with right lower quadrant abdominal pain x3 days sharp in nature without fever, nausea, vomiting or diarrhea.  Negative Covid in ED.  CT abdomen pelvis with sided diverticulitis and pericolonic/intramural abscess and adjacent stranding.  Started on Zosyn.  IR consulted however abscesses not amenable to IR drainage.  General surgery consulted and recommended conservative management with antibiotics.  A & P   Active Problems:   Diverticulitis of intestine with abscess   Diverticulitis of ascending colon and associated intramural/pericolonic abscess afebrile, hemodynamically stable without leukocytosis.  Completed 2 days Zosyn.  Abscess not amenable to IR drainage.  Family history noncontributory.  Received p.o. opiates this afternoon -General surgery consulted: Advance diet and continue IV antibiotics -Continue with pain control  Hypokalemia replete  Iron deficiency anemia stable   DVT prophylaxis: SCDs Diet: Clear liquid diet, advance as tolerated Family Communication: No family at bedside Disposition Plan: Likely discharge in a.m.  Consultants  IR General surgery  Procedures  None  Antibiotics  Zosyn 11/13->      Subjective   Patient seen sitting upright about to eat a pizza for lunch.  States his pain is much improved and has not required IV pain med since 1 AM but is still requiring p.o. pain meds.  Denies any complaints  Objective   Vitals:   11/06/19 0224 11/06/19 0602 11/06/19 1015 11/06/19 1419  BP: 127/77 123/76 131/85 139/76  Pulse: 64 (!) 55 (!) 54 (!) 59  Resp: 18 20 18 18   Temp: 98.1 F (36.7 C) 98.2 F (36.8 C) 97.6 F (36.4 C) 97.9 F (36.6  C)  TempSrc: Oral Oral Oral Oral  SpO2: 100% 100% 100% 100%  Weight:      Height:        Intake/Output Summary (Last 24 hours) at 11/06/2019 1739 Last data filed at 11/06/2019 1234 Gross per 24 hour  Intake 960 ml  Output 700 ml  Net 260 ml   Filed Weights   11/04/19 1423  Weight: 80.3 kg    Examination:  Physical Exam Vitals signs and nursing note reviewed.  Constitutional:      Appearance: Normal appearance.  HENT:     Head: Normocephalic and atraumatic.     Nose: Nose normal.     Mouth/Throat:     Mouth: Mucous membranes are moist.  Eyes:     Extraocular Movements: Extraocular movements intact.  Neck:     Musculoskeletal: Normal range of motion. No neck rigidity.  Cardiovascular:     Rate and Rhythm: Normal rate and regular rhythm.  Pulmonary:     Effort: Pulmonary effort is normal.     Breath sounds: Normal breath sounds.  Abdominal:     General: Abdomen is flat.     Palpations: Abdomen is soft.  Musculoskeletal: Normal range of motion.        General: No swelling.  Neurological:     General: No focal deficit present.     Mental Status: He is alert. Mental status is at baseline.  Psychiatric:        Mood and Affect: Mood normal.        Behavior: Behavior normal.     Data Reviewed:  I have personally reviewed following labs and imaging studies  CBC: Recent Labs  Lab 11/04/19 1524 11/05/19 0334 11/06/19 0337  WBC 9.3 8.5 7.4  HGB 12.8* 12.7* 13.5  HCT 40.6 41.2 43.9  MCV 95.5 94.1 94.8  PLT 217 213 232   Basic Metabolic Panel: Recent Labs  Lab 11/04/19 1524 11/05/19 0334 11/06/19 0337  NA 137 138 138  K 3.7 3.4* 3.4*  CL 105 105 104  CO2 23 23 24   GLUCOSE 100* 93 91  BUN 14 11 8   CREATININE 0.92 0.93 0.99  CALCIUM 8.8* 8.6* 8.7*  MG  --  2.0  --   PHOS  --  3.1  --    GFR: Estimated Creatinine Clearance: 109.3 mL/min (by C-G formula based on SCr of 0.99 mg/dL). Liver Function Tests: Recent Labs  Lab 11/04/19 1524  11/05/19 0334  AST 26 21  ALT 22 20  ALKPHOS 75 69  BILITOT 0.4 0.5  PROT 7.6 7.2  ALBUMIN 3.9 3.5   Recent Labs  Lab 11/04/19 1524  LIPASE 28   No results for input(s): AMMONIA in the last 168 hours. Coagulation Profile: No results for input(s): INR, PROTIME in the last 168 hours. Cardiac Enzymes: No results for input(s): CKTOTAL, CKMB, CKMBINDEX, TROPONINI in the last 168 hours. BNP (last 3 results) No results for input(s): PROBNP in the last 8760 hours. HbA1C: No results for input(s): HGBA1C in the last 72 hours. CBG: No results for input(s): GLUCAP in the last 168 hours. Lipid Profile: No results for input(s): CHOL, HDL, LDLCALC, TRIG, CHOLHDL, LDLDIRECT in the last 72 hours. Thyroid Function Tests: Recent Labs    11/05/19 0334  TSH 2.467   Anemia Panel: No results for input(s): VITAMINB12, FOLATE, FERRITIN, TIBC, IRON, RETICCTPCT in the last 72 hours. Sepsis Labs: Recent Labs  Lab 11/04/19 1910 11/04/19 2223  LATICACIDVEN 0.9 1.1    Recent Results (from the past 240 hour(s))  Urine culture     Status: None   Collection Time: 11/04/19  5:57 PM   Specimen: Urine, Random  Result Value Ref Range Status   Specimen Description   Final    URINE, RANDOM Performed at [facility], [address] [place], [place] [address]    Special Requests   Final    NONE Performed at [facility], [address] [place], [place] [address]    Culture   Final    NO GROWTH Performed at [facility], [address] [place], [place] [address]    Report Status 11/06/2019 FINAL  Final  Blood culture (routine x 2)     Status: None (Preliminary result)   Collection Time: 11/04/19  7:10 PM   Specimen: BLOOD  Result Value Ref Range Status   Specimen Description   Final    BLOOD BLOOD LEFT HAND Performed at [facility], [address] [place], [place] [address]    Special Requests   Final    BOTTLES  DRAWN AEROBIC AND ANAEROBIC Blood Culture adequate volume Performed at [facility], [address] [place], [place] [address]    Culture   Final    NO GROWTH 2 DAYS Performed at [facility], [address] [place], [place] [address]    Report Status PENDING  Incomplete  SARS CORONAVIRUS 2 (TAT 6-24 HRS) Nasopharyngeal Nasopharyngeal Swab     Status: None   Collection Time: 11/04/19  8:22 PM   Specimen:  Nasopharyngeal Swab  Result Value Ref Range Status   SARS Coronavirus 2 NEGATIVE NEGATIVE Final    Comment: (NOTE) SARS-CoV-2 target nucleic acids are NOT DETECTED. The SARS-CoV-2 RNA is generally detectable in upper and lower respiratory specimens during the acute phase of infection. Negative results do not preclude SARS-CoV-2 infection, do not rule out co-infections with other pathogens, and should not be used as the sole basis for treatment or other patient management decisions. Negative results must be combined with clinical observations, patient history, and epidemiological information. The expected result is Negative. Fact Sheet for Patients: HairSlick.no Fact Sheet for Healthcare Providers: quierodirigir.com This test is not yet approved or cleared by the [place] FDA and  has been authorized for detection and/or diagnosis of SARS-CoV-2 by FDA under an Emergency Use Authorization (EUA). This EUA will remain  in effect (meaning this test can be used) for the duration of the COVID-19 declaration under Section 56 4(b)(1) of the Act, 21 U.S.C. section 360bbb-3(b)(1), unless the authorization is terminated or revoked sooner. Performed at [facility], [address] [place], [place] [address]   Blood culture (routine x 2)     Status: None (Preliminary result)   Collection Time: 11/04/19 10:23 PM   Specimen: BLOOD  Result Value Ref Range Status   Specimen Description   Final     BLOOD RIGHT ANTECUBITAL Performed at [facility], [address], [place], [place] [address]    Special Requests   Final    BOTTLES DRAWN AEROBIC AND ANAEROBIC Blood Culture adequate volume Performed at [facility], [address], [place], [place] [address]    Culture   Final    NO GROWTH 1 DAY Performed at [facility], [address], [place], [place] [address]    Report Status PENDING  Incomplete         Radiology Studies: Ct Abdomen Pelvis W Contrast  Addendum Date: 11/04/2019   ADDENDUM REPORT: 11/04/2019 18:50 ADDENDUM: These results were called by telephone at the time of interpretation on 11/04/2019 at 6:50 pm to provider [facility] , who verbally acknowledged these results. Electronically Signed   By: Donzetta Kohut M.D.   On: 11/04/2019 18:50   Result Date: 11/04/2019 CLINICAL DATA:  Abdominal pain, suspected appendicitis. EXAM: CT ABDOMEN AND PELVIS WITH CONTRAST TECHNIQUE: Multidetector CT imaging of the abdomen and pelvis was performed using the standard protocol following bolus administration of intravenous contrast. CONTRAST:  OMNIPAQUE IOHEXOL 300 MG/ML  SOLN COMPARISON:  None. FINDINGS: Lower chest: Is no sign of consolidation or pleural effusion. Hepatobiliary: No sign of focal hepatic lesion. Portal vein and SMV are patent. Biliary tree is normal. Pancreas: No signs of inflammation, ductal dilation or lesion. Spleen: Normal Adrenals/Urinary Tract: Normal appearance of bilateral adrenal glands. No signs of hydronephrosis. Symmetric enhancement of the kidneys. Stomach/Bowel: Stomach and small bowel are normal. There is stranding in the right lower quadrant that is centered about the ascending colon which shows severe changes of diverticular disease. There is a presumed intramural abscess along the lateral wall of the right colon. This measures 1.7 x 1.4 by 3.2 cm and is inseparable from the thickened colonic wall.  This is approximately 2-3 cm distal to the ileocecal valve. The appendix is normal. Vascular/Lymphatic: Scattered atherosclerosis. No signs of adenopathy in the abdomen or retroperitoneum. No signs of pelvic lymphadenopathy. Reproductive: Prostate normal by CT, nonspecific finding. Other: No signs of free intraperitoneal air. Musculoskeletal: Degenerative changes, marked  at L5-S1. No signs of acute or destructive bone process. IMPRESSION: 1. Signs of acute right-sided diverticulitis with pericolonic/intramural abscess and adjacent stranding. 2. Marked colonic thickening may simply be related to diverticular disease; however, follow-up colonoscopy or correlation with recent colonoscopy results is suggested to exclude underlying neoplasm. 3. Atherosclerotic changes in the abdominal aorta. 4. Normal appendix. 5. A call is out to the referring provider to further discuss above findings. Aortic Atherosclerosis (ICD10-I70.0). Electronically Signed: By: Donzetta Kohut M.D. On: 11/04/2019 18:44        Scheduled Meds: Continuous Infusions:  piperacillin-tazobactam (ZOSYN)  IV 3.375 g (11/06/19 1234)     LOS: 2 days    Time spent: 15 minutes with over 50% of the time coordinating the patient's care    Jae Dire, DO [facility] Pager [phone]  If 7PM-7AM, please contact night-coverage [email or web address] Password [facility] 11/06/2019, 5:39 PM

## 2019-11-07 LAB — BASIC METABOLIC PANEL
Anion gap: 10 (ref 5–15)
BUN: 10 mg/dL (ref 6–20)
CO2: 22 mmol/L (ref 22–32)
Calcium: 9 mg/dL (ref 8.9–10.3)
Chloride: 107 mmol/L (ref 98–111)
Creatinine, Ser: 1.06 mg/dL (ref 0.61–1.24)
GFR calc Af Amer: >60 mL/min (ref >60)
GFR calc non Af Amer: >60 mL/min (ref >60)
Glucose, Bld: 98 mg/dL (ref 70–99)
Potassium: 4 mmol/L (ref 3.5–5.1)
Sodium: 139 mmol/L (ref 135–145)

## 2019-11-07 LAB — CBC
HCT: 44.2 % (ref 39.0–52.0)
Hemoglobin: 13.5 g/dL (ref 13.0–17.0)
MCH: 28.8 pg (ref 26.0–34.0)
MCHC: 30.5 g/dL (ref 30.0–36.0)
MCV: 94.4 fL (ref 80.0–100.0)
Platelets: 236 10*3/uL (ref 150–400)
RBC: 4.68 MIL/uL (ref 4.22–5.81)
RDW: 13.7 % (ref 11.5–15.5)
WBC: 3.9 10*3/uL — ABNORMAL LOW (ref 4.0–10.5)
nRBC: 0 % (ref 0.0–0.2)

## 2019-11-07 MED ORDER — AMOXICILLIN-POT CLAVULANATE 875-125 MG PO TABS: 1.0000 | ORAL_TABLET | Freq: Two times a day (BID) | ORAL | 0 refills | Status: DC

## 2019-11-07 MED ORDER — TRAMADOL HCL 50 MG PO TABS: 50.0000 mg | ORAL_TABLET | Freq: Four times a day (QID) | ORAL | 0 refills | Status: AC | PRN

## 2019-11-07 MED ORDER — AMOXICILLIN-POT CLAVULANATE 875-125 MG PO TABS: 1.0000 | ORAL_TABLET | Freq: Two times a day (BID) | ORAL | 0 refills | Status: AC

## 2019-11-07 NOTE — Progress Notes (Signed)
    CC:  Subjective: Patient sitting up in bed and anxious to go home.  He seems to think his diverticulitis is secondary to drinking.  He plans to quit drinking.  We talked about this and I suggested that his drinking did not cause diverticulosis.  Objective: Vital signs in last 24 hours: Temp:  [97.6 F (36.4 C)-98.4 F (36.9 C)] 98 F (36.7 C) (11/16 0545) Pulse Rate:  [51-61] 61 (11/16 0545) Resp:  [18] 18 (11/16 0545) BP: (121-139)/(76-86) 129/79 (11/16 0545) SpO2:  [100 %] 100 % (11/16 0545) Last BM Date: 11/04/19 600 p.o. 73 IV Urine 600 BM x2 Afebrile vital signs are stable WBC 3.9; BMP is normal. CT scan 11/04/2019:1. Signs of acute right-sided diverticulitis with pericolonic/intramural abscess and adjacent stranding. 2. Marked colonic thickening may simply be related to diverticular disease; however, follow-up colonoscopy or correlation with recent colonoscopy results is suggested to exclude underlying neoplasm. 3. Atherosclerotic changes in the abdominal aorta. 4. Normal appendix.  Intake/Output from previous day: 11/15 0701 - 11/16 0700 In: 673.4 [P.O.:600; IV Piggyback:73.4] Out: 600 [Urine:600] Intake/Output this shift: No intake/output data recorded.  General appearance: alert, cooperative and no distress Resp: clear to auscultation bilaterally GI: soft, non-tender; bowel sounds normal; no masses,  no organomegaly  Lab Results:  Recent Labs    11/06/19 0337 11/07/19 0352  WBC 7.4 3.9*  HGB 13.5 13.5  HCT 43.9 44.2  PLT 232 236    BMET Recent Labs    11/06/19 0337 11/07/19 0352  NA 138 139  K 3.4* 4.0  CL 104 107  CO2 24 22  GLUCOSE 91 98  BUN 8 10  CREATININE 0.99 1.06  CALCIUM 8.7* 9.0   PT/INR No results for input(s): LABPROT, INR in the last 72 hours.  Recent Labs  Lab 11/04/19 1524 11/05/19 0334  AST 26 21  ALT 22 20  ALKPHOS 75 69  BILITOT 0.4 0.5  PROT 7.6 7.2  ALBUMIN 3.9 3.5     Lipase     Component Value  Date/Time   LIPASE 28 11/04/2019 1524     Medications:   Assessment/Plan Hypokalemia Anemia Hx GI bleed 07/2019/BRBPR  -Colonoscopy Dr. Saralyn Pilar HUNG 08/18/2019  Diverticulitis with pericolonic/intramural abscess, and adjacent stranding  - Hx coliits FEN: Soft diet ID: Zosyn 11/13 -11/16; converting to Augmentin 11/16 DVT: SCDs Follow-up: PCP, and Dr. Benson Norway.   Plan: Patient is scheduled for discharge on Augmentin.  He had a colonoscopy by Dr. Benson Norway after his GI bleed in August.  I told him to follow-up with Dr. Benson Norway also.    LOS: 3 days    Peter Armstrong 11/07/2019 Please see Amion

## 2019-11-07 NOTE — Discharge Summary (Signed)
Physician Discharge Summary  Peter Armstrong WUJ:[id number] DOB: [birth year] DOA: 11/04/2019  PCP: Patient, No Pcp Per  Admit date: 11/04/2019  Discharge date: 11/07/2019   Code Status: Full Code  Admitted From: Home Discharged to: Home Home Health: No Equipment/Devices: No Discharge Condition: Stable  Recommendations med rec for Outpatient Follow-up   1. Follow up with PCP in 1 week 2. Please follow up BMP/CBC  3. Tramadol as needed for moderate to severe pain over the next 3 days 4. Augmentin twice daily x8 days (total 10 days antibiotics)  Hospital Summary  43 year old male past medical history of diverticulosis on colonoscopy recent GI bleed requiring blood transfusion August 2020 presented to the ED on 11/13 with right lower quadrant abdominal pain x3 days sharp in nature without fever, nausea, vomiting or diarrhea.  Negative Covid in ED.  CT abdomen pelvis with sided diverticulitis and pericolonic/intramural abscess and adjacent stranding.  Started on Zosyn.  IR consulted however abscesses not amenable to IR drainage.  General surgery consulted and recommended conservative management with antibiotics.  He completed 2 days Zosyn IV while hospitalized and discharged in stable condition after tolerating p.o. intake and requiring minimal pain medication on Augmentin for 8 more days, total 10 days antibiotics   A & P   Active Problems:   Diverticulitis of intestine with abscess  Diverticulitis of ascending colon and associated intramural/pericolonic abscess afebrile, hemodynamically stable without leukocytosis.  Completed 3 days Zosyn.  Abscess not amenable to IR drainage.  Family history noncontributory.  Tolerating pain without much pain medication -General surgery: Continue with soft diet for the next week -Tramadol every 6 hours as needed for moderate to severe pain -Augmentin twice daily x8 days, total 10 days antibiotics  Hypokalemia  solved  Iron deficiency anemia  stable -Continue p.o. iron   Consultants  . IR . General surgery  Procedures  . None  Antibiotics  Zosyn 11/13-11/16 Augmentin 11/16->7 days   Subjective  Patient seen and examined at bedside no acute distress and resting comfortably.  No events overnight.  Had pizza for dinner last night without any issues.  Has not had any recent pain medication since last night and denies any abdominal pain, nausea, vomiting, diarrhea.  Did have a bowel movement which was formed and without blood.  In good spirits and anticipating discharge.   Otherwise ROS negative   Objective   Discharge Exam: Vitals:   11/06/19 2217 11/07/19 0545  BP: 121/86 129/79  Pulse: (!) 51 61  Resp: 18 18  Temp: 98.4 F (36.9 C) 98 F (36.7 C)  SpO2: 100% 100%   Vitals:   11/06/19 1015 11/06/19 1419 11/06/19 2217 11/07/19 0545  BP: 131/85 139/76 121/86 129/79  Pulse: (!) 54 (!) 59 (!) 51 61  Resp: Temp: 97.6 F (36.4 C) 97.9 F (36.6 C) 98.4 F (36.9 C) 98 F (36.7 C)  TempSrc: Oral Oral Oral Oral  SpO2: 100% 100% 100% 100%  Weight:      Height:        Physical Exam Vitals signs and nursing note reviewed.  Constitutional:      Appearance: Normal appearance.  HENT:     Head: Normocephalic and atraumatic.     Nose: Nose normal.     Mouth/Throat:     Mouth: Mucous membranes are moist.  Eyes:     Extraocular Movements: Extraocular movements intact.  Neck:     Musculoskeletal: Normal range of motion. No neck rigidity.  Cardiovascular:  Rate and Rhythm: Normal rate and regular rhythm.  Pulmonary:     Effort: Pulmonary effort is normal.     Breath sounds: Normal breath sounds.  Abdominal:     General: Abdomen is flat.     Palpations: Abdomen is soft.  Musculoskeletal: Normal range of motion.        General: No swelling.  Neurological:     General: No focal deficit present.     Mental Status: He is alert. Mental status is at baseline.  Psychiatric:        Mood and  Affect: Mood normal.        Behavior: Behavior normal.       The results of significant diagnostics from this hospitalization (including imaging, microbiology, ancillary and laboratory) are listed below for reference.     Microbiology: Recent Results (from the past 240 hour(s))  Urine culture     Status: None   Collection Time: 11/04/19  5:57 PM   Specimen: Urine, Random  Result Value Ref Range Status   Specimen Description   Final    URINE, RANDOM Performed at [facility], [address], [place], [place] [address]    Special Requests   Final    NONE Performed at [facility], [address], [place], [place] [address]    Culture   Final    NO GROWTH Performed at [facility], [address], [place], [place] [address]    Report Status 11/06/2019 FINAL  Final  Blood culture (routine x 2)     Status: None (Preliminary result)   Collection Time: 11/04/19  7:10 PM   Specimen: BLOOD  Result Value Ref Range Status   Specimen Description   Final    BLOOD BLOOD LEFT HAND Performed at [facility], [address], [place], [place] [address]    Special Requests   Final    BOTTLES DRAWN AEROBIC AND ANAEROBIC Blood Culture adequate volume Performed at [facility], [address] [place], [place] [address]    Culture   Final    NO GROWTH 2 DAYS Performed at [facility], [address] [place], [place] [address]    Report Status PENDING  Incomplete  SARS CORONAVIRUS 2 (TAT 6-24 HRS) Nasopharyngeal Nasopharyngeal Swab     Status: None   Collection Time: 11/04/19  8:22 PM   Specimen: Nasopharyngeal Swab  Result Value Ref Range Status   SARS Coronavirus 2 NEGATIVE NEGATIVE Final    Comment: (NOTE) SARS-CoV-2 target nucleic acids are NOT DETECTED. The SARS-CoV-2 RNA is generally detectable in upper and lower respiratory specimens during the acute phase of infection.  Negative results do not preclude SARS-CoV-2 infection, do not rule out co-infections with other pathogens, and should not be used as the sole basis for treatment or other patient management decisions. Negative results must be combined with clinical observations, patient history, and epidemiological information. The expected result is Negative. Fact Sheet for Patients: HairSlick.no Fact Sheet for Healthcare Providers: quierodirigir.com This test is not yet approved or cleared by the [place] FDA and  has been authorized for detection and/or diagnosis of SARS-CoV-2 by FDA under an Emergency Use Authorization (EUA). This EUA will remain  in effect (meaning this test can be used) for the duration of the COVID-19 declaration under Section 56 4(b)(1) of the Act, 21 U.S.C. section 360bbb-3(b)(1), unless the authorization is terminated or revoked sooner. Performed at [facility],  [address], [place], [place] [address]   Blood culture (routine x 2)     Status: None (Preliminary result)   Collection Time: 11/04/19 10:23 PM   Specimen: BLOOD  Result Value Ref Range Status   Specimen Description   Final    BLOOD RIGHT ANTECUBITAL Performed at [facility], [address] [place], [place] [address]    Special Requests   Final    BOTTLES DRAWN AEROBIC AND ANAEROBIC Blood Culture adequate volume Performed at [facility], [address], [place], [place] [address]    Culture   Final    NO GROWTH 1 DAY Performed at [facility], [address] [place], [place] [address]    Report Status PENDING  Incomplete     Labs: BNP (last 3 results) No results for input(s): BNP in the last 8760 hours. Basic Metabolic Panel: Recent Labs  Lab 11/04/19 1524 11/05/19 0334 11/06/19 0337 11/07/19 0352  NA 137 138 138 139  K 3.7 3.4* 3.4* 4.0  CL 105 105 104 107  CO2 23 23 24 22    GLUCOSE 100* 93 91 98  BUN 14 11 8 10   CREATININE 0.92 0.93 0.99 1.06  CALCIUM 8.8* 8.6* 8.7* 9.0  MG  --  2.0  --   --   PHOS  --  3.1  --   --    Liver Function Tests: Recent Labs  Lab 11/04/19 1524 11/05/19 0334  AST 26 21  ALT 22 20  ALKPHOS 75 69  BILITOT 0.4 0.5  PROT 7.6 7.2  ALBUMIN 3.9 3.5   Recent Labs  Lab 11/04/19 1524  LIPASE 28   No results for input(s): AMMONIA in the last 168 hours. CBC: Recent Labs  Lab 11/04/19 1524 11/05/19 0334 11/06/19 0337 11/07/19 0352  WBC 9.3 8.5 7.4 3.9*  HGB 12.8* 12.7* 13.5 13.5  HCT 40.6 41.2 43.9 44.2  MCV 95.5 94.1 94.8 94.4  PLT 217 213 232 236   Cardiac Enzymes: No results for input(s): CKTOTAL, CKMB, CKMBINDEX, TROPONINI in the last 168 hours. BNP: Invalid input(s): POCBNP CBG: No results for input(s): GLUCAP in the last 168 hours. D-Dimer No results for input(s): DDIMER in the last 72 hours. Hgb A1c No results for input(s): HGBA1C in the last 72 hours. Lipid Profile No results for input(s): CHOL, HDL, LDLCALC, TRIG, CHOLHDL, LDLDIRECT in the last 72 hours. Thyroid function studies Recent Labs    11/05/19 0334  TSH 2.467   Anemia work up No results for input(s): VITAMINB12, FOLATE, FERRITIN, TIBC, IRON, RETICCTPCT in the last 72 hours. Urinalysis    Component Value Date/Time   COLORURINE STRAW (A) 11/04/2019 1757   APPEARANCEUR CLEAR 11/04/2019 1757   LABSPEC 1.010 11/04/2019 1757   PHURINE 5.0 11/04/2019 1757   GLUCOSEU NEGATIVE 11/04/2019 1757   HGBUR NEGATIVE 11/04/2019 1757   BILIRUBINUR NEGATIVE 11/04/2019 1757   KETONESUR NEGATIVE 11/04/2019 1757   PROTEINUR NEGATIVE 11/04/2019 1757   UROBILINOGEN 0.2 05/08/2008 0915   NITRITE NEGATIVE 11/04/2019 1757   LEUKOCYTESUR NEGATIVE 11/04/2019 1757   Sepsis Labs Invalid input(s): PROCALCITONIN,  WBC,  LACTICIDVEN Microbiology Recent Results (from the past 240 hour(s))  Urine culture     Status: None   Collection Time: 11/04/19  5:57 PM    Specimen: Urine, Random  Result Value Ref Range Status   Specimen Description   Final    URINE, RANDOM Performed at [facility], [address] [facility] [place],  [place] [address]    Special Requests   Final    NONE Performed at [facility], [address], [place], [place] [address]    Culture   Final    NO GROWTH Performed at [facility], [address], [place], [place] [address]    Report Status 11/06/2019 FINAL  Final  Blood culture (routine x 2)     Status: None (Preliminary result)   Collection Time: 11/04/19  7:10 PM   Specimen: BLOOD  Result Value Ref Range Status   Specimen Description   Final    BLOOD BLOOD LEFT HAND Performed at [facility], [address], [place], [place] [address]    Special Requests   Final    BOTTLES DRAWN AEROBIC AND ANAEROBIC Blood Culture adequate volume Performed at [facility], [address], [place], [place] [address]    Culture   Final    NO GROWTH 2 DAYS Performed at [facility], [address] [place], [place] [address]    Report Status PENDING  Incomplete  SARS CORONAVIRUS 2 (TAT 6-24 HRS) Nasopharyngeal Nasopharyngeal Swab     Status: None   Collection Time: 11/04/19  8:22 PM   Specimen: Nasopharyngeal Swab  Result Value Ref Range Status   SARS Coronavirus 2 NEGATIVE NEGATIVE Final    Comment: (NOTE) SARS-CoV-2 target nucleic acids are NOT DETECTED. The SARS-CoV-2 RNA is generally detectable in upper and lower respiratory specimens during the acute phase of infection. Negative results do not preclude SARS-CoV-2 infection, do not rule out co-infections with other pathogens, and should not be used as the sole basis for treatment or other patient management decisions. Negative results must be combined with clinical observations, patient history, and epidemiological information. The expected result is Negative. Fact Sheet for  Patients: HairSlick.no Fact Sheet for Healthcare Providers: quierodirigir.com This test is not yet approved or cleared by the [place] FDA and  has been authorized for detection and/or diagnosis of SARS-CoV-2 by FDA under an Emergency Use Authorization (EUA). This EUA will remain  in effect (meaning this test can be used) for the duration of the COVID-19 declaration under Section 56 4(b)(1) of the Act, 21 U.S.C. section 360bbb-3(b)(1), unless the authorization is terminated or revoked sooner. Performed at [facility], [address] [place], [place] [address]   Blood culture (routine x 2)     Status: None (Preliminary result)   Collection Time: 11/04/19 10:23 PM   Specimen: BLOOD  Result Value Ref Range Status   Specimen Description   Final    BLOOD RIGHT ANTECUBITAL Performed at [facility], [address] [place], [place] [address]    Special Requests   Final    BOTTLES DRAWN AEROBIC AND ANAEROBIC Blood Culture adequate volume Performed at [facility], [address], [place], [place] [address]    Culture   Final    NO GROWTH 1 DAY Performed at [facility], [address] [place], [place] [address]    Report Status PENDING  Incomplete    Discharge Instructions     Discharge Instructions    Discharge instructions   Complete by: As directed    You were seen and examined in the hospital for diverticulitis and abscess and cared for by a hospitalist.   Upon Discharge:  -Take Augmentin twice daily by mouth for the next 8 days for total of 10 days antibiotics -Take Tramadol every  6 hours as needed for moderate pain to severe pain -If you have any significant change or worsening of your symptoms please do not hesitate to contact your primary care physician or return to the ED -Monitor for severe or persistent diarrhea -Stick to a soft diet for the next week and  advance to solids as tolerated Make an appointment with your primary care physician within 7 days Get lab work prior to your follow up appointment with your PCP Bring all home medications to your appointment to review Request that your primary physician go over all hospital tests and procedures/radiological results at the follow up.   Please get all hospital records sent to your physician by signing a hospital release before you go home.     Read the complete instructions along with all the possible side effects for all the medicines you take and that have been prescribed to you. Take any new medicines after you have completely understood and accept all the possible adverse reactions/side effects.   If you have any questions about your discharge medications or the care you received while you were in the hospital, you can call the unit and asked to speak with the hospitalist on call. Once you are discharged, your primary care physician will handle any further medical issues. Please note that NO REFILLS for any discharge medications will be authorized, as it is imperative that you return to your primary care physician (or establish a relationship with a primary care physician if you do not have one) for your aftercare needs so that they can reassess your need for medications and monitor your lab values.   Do not drive, operate heavy machinery, perform activities at heights, swimming or participation in water activities or provide baby sitting services if your were admitted for loss of consciousness/seizures or if you are on sedating medications including, but not limited to benzodiazepines, sleep medications, narcotic pain medications, etc., until you have been cleared to do so by a medical doctor.   Do not take more than prescribed medications.   Wear a seat belt while driving.  If you have smoked or chewed Tobacco in the last 2 years please stop smoking; also stop any regular Alcohol and/or any  Recreational drug use including marijuana.  If you experience worsening of your admission symptoms or develop shortness of breath, chest pain, suicidal or homicidal thoughts or experience a life threatening emergency, you must seek medical attention immediately by calling 911 or calling your PCP immediately.   Increase activity slowly   Complete by: As directed      Allergies as of 11/07/2019   No Known Allergies     Medication List    TAKE these medications   amoxicillin-clavulanate 875-125 MG tablet Commonly known as: Augmentin Take 1 tablet by mouth 2 (two) times daily for 8 days.   BC FAST PAIN RELIEF PO Take 1 packet by mouth every 6 (six) hours as needed (pain).   docusate sodium 100 MG capsule Commonly known as: COLACE Take 100 mg by mouth daily.   ferrous sulfate 325 (65 FE) MG tablet Take 1 tablet (325 mg total) by mouth daily.   traMADol 50 MG tablet Commonly known as: Ultram Take 1 tablet (50 mg total) by mouth every 6 (six) hours as needed for up to 3 days for moderate pain or severe pain.       No Known Allergies  Time coordinating discharge: Less than 30 minutes   SIGNED:   Jae DireJared E Rether Rison,  D.O. [facility] Pager: [phone]  11/07/2019, 8:54 AM

## 2019-11-07 NOTE — Progress Notes (Signed)
Discharge and medication instructions reviewed with patient.  Questions answered and patient denies further questions. No prescriptions given. Daughter is en route to drive patient home. Chad Donoghue, RN 

## 2019-11-09 LAB — CULTURE, BLOOD (ROUTINE X 2)
Culture: NO GROWTH
Special Requests: ADEQUATE

## 2019-11-10 LAB — CULTURE, BLOOD (ROUTINE X 2)
Culture: NO GROWTH
Special Requests: ADEQUATE

## 2020-03-30 ENCOUNTER — Encounter (HOSPITAL_COMMUNITY): Payer: Self-pay

## 2020-03-30 ENCOUNTER — Other Ambulatory Visit: Payer: Self-pay

## 2020-03-30 ENCOUNTER — Emergency Department (HOSPITAL_COMMUNITY): Admission: EM | Admit: 2020-03-30 | Discharge: 2020-03-30 | Discharge status: Absent without leave | Payer: Self-pay

## 2020-03-30 ENCOUNTER — Emergency Department (HOSPITAL_COMMUNITY)
Admission: EM | Admit: 2020-03-30 | Discharge: 2020-03-31 | Disposition: A | Discharge status: Home / Self Care | Payer: Self-pay | Attending: Emergency Medicine | Admitting: Emergency Medicine

## 2020-03-30 DIAGNOSIS — K5792 Diverticulitis of intestine, part unspecified, without perforation or abscess without bleeding: Secondary | ICD-10-CM

## 2020-03-30 DIAGNOSIS — Z79899 Other long term (current) drug therapy: Secondary | ICD-10-CM | POA: Insufficient documentation

## 2020-03-30 DIAGNOSIS — K5732 Diverticulitis of large intestine without perforation or abscess without bleeding: Secondary | ICD-10-CM | POA: Insufficient documentation

## 2020-03-30 LAB — COMPREHENSIVE METABOLIC PANEL
ALT: 19 U/L (ref 0–44)
AST: 21 U/L (ref 15–41)
Albumin: 4.3 g/dL (ref 3.5–5.0)
Alkaline Phosphatase: 88 U/L (ref 38–126)
Anion gap: 10 (ref 5–15)
BUN: 13 mg/dL (ref 6–20)
CO2: 24 mmol/L (ref 22–32)
Calcium: 9.6 mg/dL (ref 8.9–10.3)
Chloride: 102 mmol/L (ref 98–111)
Creatinine, Ser: 0.89 mg/dL (ref 0.61–1.24)
GFR calc Af Amer: >60 mL/min (ref >60)
GFR calc non Af Amer: >60 mL/min (ref >60)
Glucose, Bld: 121 mg/dL — ABNORMAL HIGH (ref 70–99)
Potassium: 4.4 mmol/L (ref 3.5–5.1)
Sodium: 136 mmol/L (ref 135–145)
Total Bilirubin: 0.7 mg/dL (ref 0.3–1.2)
Total Protein: 8.3 g/dL — ABNORMAL HIGH (ref 6.5–8.1)

## 2020-03-30 LAB — CBC
HCT: 45.2 % (ref 39.0–52.0)
Hemoglobin: 14.5 g/dL (ref 13.0–17.0)
MCH: 29.9 pg (ref 26.0–34.0)
MCHC: 32.1 g/dL (ref 30.0–36.0)
MCV: 93.2 fL (ref 80.0–100.0)
Platelets: 247 10*3/uL (ref 150–400)
RBC: 4.85 MIL/uL (ref 4.22–5.81)
RDW: 13.3 % (ref 11.5–15.5)
WBC: 6.5 10*3/uL (ref 4.0–10.5)
nRBC: 0 % (ref 0.0–0.2)

## 2020-03-30 LAB — TYPE AND SCREEN
ABO/RH(D): A POS
Antibody Screen: NEGATIVE

## 2020-03-30 MED ORDER — MORPHINE SULFATE (PF) 4 MG/ML IV SOLN
4.0000 mg | Freq: Once | INTRAVENOUS | Status: AC
  Administered 2020-03-31: 4 mg via INTRAVENOUS
  Filled 2020-03-30: qty 1

## 2020-03-30 MED ORDER — SODIUM CHLORIDE 0.9 % IV BOLUS
1000.0000 mL | Freq: Once | INTRAVENOUS | Status: AC
  Administered 2020-03-31: 1000 mL via INTRAVENOUS

## 2020-03-30 MED ORDER — ONDANSETRON HCL 4 MG/2ML IJ SOLN
4.0000 mg | Freq: Once | INTRAMUSCULAR | Status: AC
  Administered 2020-03-31: 4 mg via INTRAVENOUS
  Filled 2020-03-30: qty 2

## 2020-03-30 NOTE — ED Notes (Signed)
I called patient back to be triage and no one responded 

## 2020-03-30 NOTE — ED Triage Notes (Signed)
Patient arrived stating he has had a diverticulitis flare up for about month, states two days ago started passing bright red blood clots. Reports feeling nauseated and has had some vomiting.

## 2020-03-30 NOTE — ED Provider Notes (Signed)
[name]-EMERGENCY DEPT Provider Note   CSN: [id number] Arrival date & time: 03/30/20  1848     History Chief Complaint  Patient presents with  . Abdominal Pain    Peter Armstrong is a 44 y.o. male.  Patient presents to the emergency department for evaluation of abdominal pain.  He reports that he has been having daily abdominal pain for approximately a month.  Pain is mostly on the right side of the abdomen.  Reports that he usually feels okay when he wakes up but as soon as he has a bowel movement in the morning he has pain for the rest of the day.  Over the last 2 days watery and mucousy diarrhea has now turned to blood and clots.  He reports this is similar to when he has had diverticulitis in the past.        History reviewed. No pertinent past medical history.  Patient Active Problem List   Diagnosis Date Noted  . Diverticulitis of intestine with abscess 11/04/2019  . Lower GI bleed   . Orthostatic hypotension 08/17/2019  . Painless rectal bleeding 08/16/2019  . Alcohol use 08/16/2019    Past Surgical History:  Procedure Laterality Date  . COLONOSCOPY Left 08/18/2019   Procedure: COLONOSCOPY;  Surgeon: Jeani Hawking, MD;  Location: [facility];  Service: Endoscopy;  Laterality: Left;       Family History  Problem Relation Age of Onset  . GI Bleed Father     Social History   Tobacco Use  . Smoking status: Never Smoker  . Smokeless tobacco: Never Used  Substance Use Topics  . Alcohol use: Not Currently    Comment: 6 pack of beer/day  . Drug use: Yes    Types: Marijuana    Comment: Occasional marijuana    Home Medications Prior to Admission medications   Medication Sig Start Date End Date Taking? Authorizing Provider  acetaminophen (TYLENOL) 500 MG tablet Take 1,000 mg by mouth every 6 (six) hours as needed for moderate pain.   Yes [provider]  amoxicillin-clavulanate (AUGMENTIN) 875-125 MG tablet Take 1 tablet by  mouth 2 (two) times daily. Pt started taken 03/26/20.Took old medication from 10/2019   Yes [provider]  docusate sodium (COLACE) 100 MG capsule Take 100 mg by mouth daily.   Yes [provider]  ciprofloxacin (CIPRO) 500 MG tablet Take 1 tablet (500 mg total) by mouth 2 (two) times daily. 03/31/20   Gilda Crease, MD  ferrous sulfate 325 (65 FE) MG tablet Take 1 tablet (325 mg total) by mouth daily. Patient not taking: Reported on 03/30/2020 08/19/19 08/18/20  Jerald Kief, MD  metroNIDAZOLE (FLAGYL) 500 MG tablet Take 1 tablet (500 mg total) by mouth 3 (three) times daily. 03/31/20   Gilda Crease, MD    Allergies    Patient has no known allergies.  Review of Systems   Review of Systems  Gastrointestinal: Positive for abdominal pain, blood in stool and diarrhea.  All other systems reviewed and are negative.   Physical Exam Updated Vital Signs BP (!) 160/83   Pulse (!) 59   Temp 98.2 F (36.8 C) (Oral)   Resp 20   SpO2 100%   Physical Exam Vitals and nursing note reviewed.  Constitutional:      General: He is not in acute distress.    Appearance: Normal appearance. He is well-developed.  HENT:     Head: Normocephalic and atraumatic.  Right Ear: Hearing normal.     Left Ear: Hearing normal.     Nose: Nose normal.  Eyes:     Conjunctiva/sclera: Conjunctivae normal.     Pupils: Pupils are equal, round, and reactive to light.  Cardiovascular:     Rate and Rhythm: Regular rhythm.     Heart sounds: S1 normal and S2 normal. No murmur. No friction rub. No gallop.   Pulmonary:     Effort: Pulmonary effort is normal. No respiratory distress.     Breath sounds: Normal breath sounds.  Chest:     Chest wall: No tenderness.  Abdominal:     General: Bowel sounds are normal.     Palpations: Abdomen is soft.     Tenderness: There is generalized abdominal tenderness. There is no guarding or rebound. Negative signs include Murphy's sign and  McBurney's sign.     Hernia: No hernia is present.  Musculoskeletal:        General: Normal range of motion.     Cervical back: Normal range of motion and neck supple.  Skin:    General: Skin is warm and dry.     Findings: No rash.  Neurological:     Mental Status: He is alert and oriented to person, place, and time.     GCS: GCS eye subscore is 4. GCS verbal subscore is 5. GCS motor subscore is 6.     Cranial Nerves: No cranial nerve deficit.     Sensory: No sensory deficit.     Coordination: Coordination normal.  Psychiatric:        Speech: Speech normal.        Behavior: Behavior normal.        Thought Content: Thought content normal.     ED Results / Procedures / Treatments   Labs (all labs ordered are listed, but only abnormal results are displayed) Labs Reviewed  COMPREHENSIVE METABOLIC PANEL - Abnormal; Notable for the following components:      Result Value   Glucose, Bld 121 (*)    Total Protein 8.3 (*)    All other components within normal limits  CBC  POC OCCULT BLOOD, ED  TYPE AND SCREEN    EKG None  Radiology CT ABDOMEN PELVIS W CONTRAST  Result Date: 03/31/2020 CLINICAL DATA:  Abdominal pain. EXAM: CT ABDOMEN AND PELVIS WITH CONTRAST TECHNIQUE: Multidetector CT imaging of the abdomen and pelvis was performed using the standard protocol following bolus administration of intravenous contrast. CONTRAST:  151mL OMNIPAQUE IOHEXOL 300 MG/ML  SOLN COMPARISON:  November 04, 2019 FINDINGS: Lower chest: No acute abnormality. Hepatobiliary: No focal liver abnormality is seen. No gallstones, gallbladder wall thickening, or biliary dilatation. Pancreas: Unremarkable. No pancreatic ductal dilatation or surrounding inflammatory changes. Spleen: Normal in size without focal abnormality. Adrenals/Urinary Tract: Adrenal glands are unremarkable. Kidneys are normal, without renal calculi, focal lesion, or hydronephrosis. Bladder is unremarkable. Stomach/Bowel: Stomach is within  normal limits. Appendix appears normal. No evidence of bowel dilatation. Diverticula are seen throughout the large bowel. A segment of moderately thickened and inflamed mid sigmoid colon is noted. There is no evidence of associated perforation or abscess. Vascular/Lymphatic: There is mild calcification of the abdominal aorta. No enlarged abdominal or pelvic lymph nodes. Reproductive: Prostate is unremarkable. Other: No abdominal wall hernia or abnormality. No abdominopelvic ascites. Musculoskeletal: No acute or significant osseous findings. IMPRESSION: 1. Moderate severity sigmoid colitis and/or diverticulitis. 2. Aortic atherosclerosis. Aortic Atherosclerosis (ICD10-I70.0). Electronically Signed   By: Joyce Gross.D.  On: 03/31/2020 01:00    Procedures Procedures (including critical care time)  Medications Ordered in ED Medications  sodium chloride (PF) 0.9 % injection (has no administration in time range)  ciprofloxacin (CIPRO) IVPB 400 mg (400 mg Intravenous New Bag/Given 03/31/20 0137)  sodium chloride 0.9 % bolus 1,000 mL (0 mLs Intravenous Stopped 03/31/20 0139)  ondansetron (ZOFRAN) injection 4 mg (4 mg Intravenous Given 03/31/20 0005)  morphine 4 MG/ML injection 4 mg (4 mg Intravenous Given 03/31/20 0005)  iohexol (OMNIPAQUE) 300 MG/ML solution 100 mL (100 mLs Intravenous Contrast Given 03/31/20 0036)  metroNIDAZOLE (FLAGYL) tablet 500 mg (500 mg Oral Given 03/31/20 0138)    ED Course  I have reviewed the triage vital signs and the nursing notes.  Pertinent labs & imaging results that were available during my care of the patient were reviewed by me and considered in my medical decision making (see chart for details).    MDM Rules/Calculators/A&P                      Patient presents to the emergency department for evaluation of abdominal pain.  Patient has a history of recurrent diverticulitis.  He has had complicated diverticulitis in the past with abscess formation and heavy  bleeding requiring blood transfusions.  Patient reports that this pain has been ongoing for a month.  He did start taking some Augmentin that he had at home earlier this week but felt like it made it worse and he stopped after a few days.  Patient reports that he has been noticing blood and clots for the last 2 days.  He has had associated nausea and vomiting.  Lab work is normal.  No leukocytosis, normal hemoglobin.  CT scan shows sigmoid diverticulitis without abscess formation.  No evidence of perforation.  Patient has not had any further bleeding here in the ER.  He does report that he takes aspirin daily and this will be stopped.  He has had fairly significant bleeding requiring him to receive transfusions in the past, however hemoglobin is normal today.  Gave patient option of observation admission versus outpatient treatment.  As there are no complicating features on his CT and he appears well, outpatient management is felt to be appropriate.  He would like to go home.  He was given return precautions and will be treated with Cipro and Flagyl.  Final Clinical Impression(s) / ED Diagnoses Final diagnoses:  Diverticulitis    Rx / DC Orders ED Discharge Orders         Ordered    ciprofloxacin (CIPRO) 500 MG tablet  2 times daily     03/31/20 0146    metroNIDAZOLE (FLAGYL) 500 MG tablet  3 times daily     03/31/20 0146           Gilda Crease, MD 03/31/20 [phone]

## 2020-03-30 NOTE — ED Notes (Signed)
I called patient name in the lobby to be triage and no one responded.  I went outside and called name as well

## 2020-03-31 ENCOUNTER — Emergency Department (HOSPITAL_COMMUNITY): Payer: Self-pay

## 2020-03-31 ENCOUNTER — Encounter (HOSPITAL_COMMUNITY): Payer: Self-pay

## 2020-03-31 MED ORDER — METRONIDAZOLE 500 MG PO TABS
500.0000 mg | ORAL_TABLET | Freq: Once | ORAL | Status: AC
  Administered 2020-03-31: 02:00:00 500 mg via ORAL
  Filled 2020-03-31: qty 1

## 2020-03-31 MED ORDER — METRONIDAZOLE 500 MG PO TABS: 500.0000 mg | ORAL_TABLET | Freq: Three times a day (TID) | ORAL | 0 refills | Status: AC

## 2020-03-31 MED ORDER — CIPROFLOXACIN IN D5W 400 MG/200ML IV SOLN
400.0000 mg | Freq: Once | INTRAVENOUS | Status: AC
  Administered 2020-03-31: 02:00:00 400 mg via INTRAVENOUS
  Filled 2020-03-31: qty 200

## 2020-03-31 MED ORDER — CIPROFLOXACIN HCL 500 MG PO TABS: 500.0000 mg | ORAL_TABLET | Freq: Two times a day (BID) | ORAL | 0 refills | Status: AC

## 2020-03-31 MED ORDER — SODIUM CHLORIDE (PF) 0.9 % IJ SOLN
INTRAMUSCULAR | Status: AC
  Filled 2020-03-31: qty 50

## 2020-03-31 MED ORDER — IOHEXOL 300 MG/ML  SOLN
100.0000 mL | Freq: Once | INTRAMUSCULAR | Status: AC | PRN
  Administered 2020-03-31: 100 mL via INTRAVENOUS

## 2020-03-31 NOTE — Discharge Instructions (Signed)
If you develop fever, increasing pain, increased bleeding, return to the ER for repeat evaluation.  Follow-up with your gastroenterologist (Dr. Elnoria Howard) in a week.

## 2021-07-24 IMAGING — CT CT ABD-PELV W/ CM
2 of 5 series · 16 of 46 positions shown, 18 images · IV contrast (omnipaque)
Comparison: November 04, 2019

CLINICAL DATA: Abdominal pain.

EXAM:
CT ABDOMEN AND PELVIS WITH CONTRAST
TECHNIQUE: Multidetector CT imaging of the abdomen and pelvis was performed
using the standard protocol following bolus administration of
intravenous contrast.
CONTRAST:  100mL OMNIPAQUE IOHEXOL 300 MG/ML  SOLN

[Series 2: axial st · axial · 0.65mm/px · z∈[-534,-149]mm · 13 of 89 slices shown, 15 images]
[im 6/89  soft-tissue]
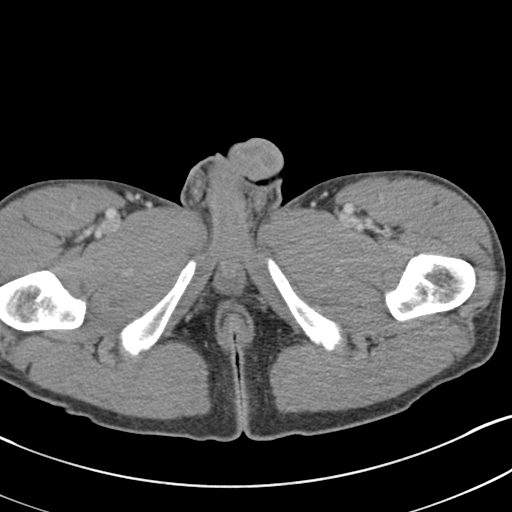
[im 6/89  bone]
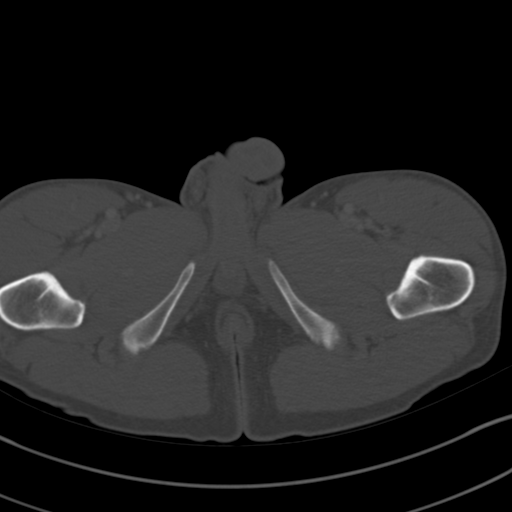
[im 12/89  soft-tissue]
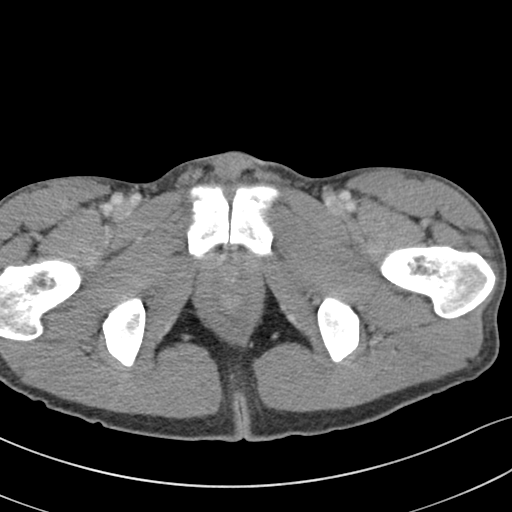
[im 18/89  soft-tissue]
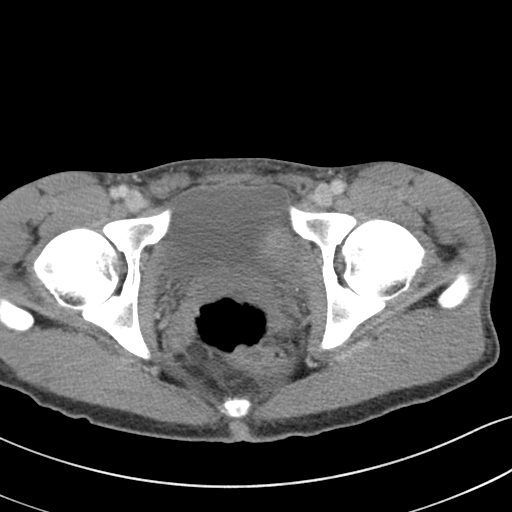
[im 24/89  soft-tissue]
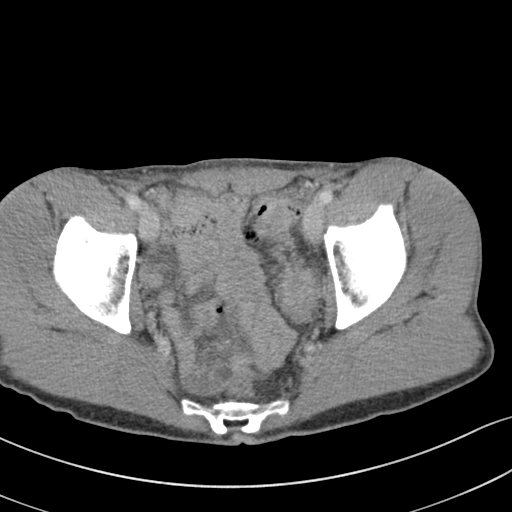
[im 30/89  soft-tissue]
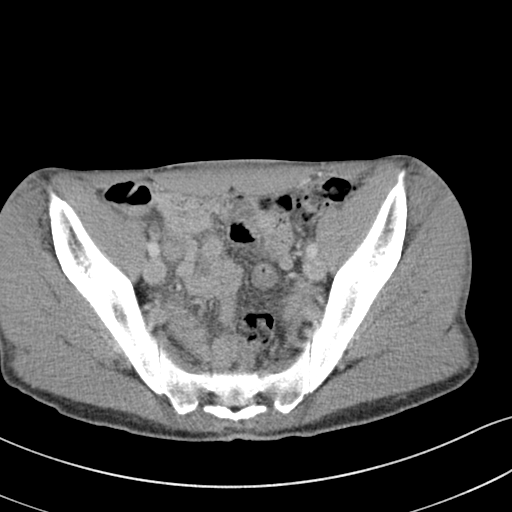
[im 36/89  soft-tissue]
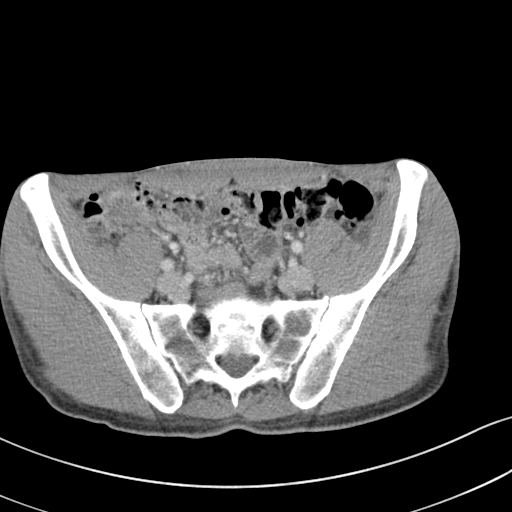
[im 47/89  soft-tissue]
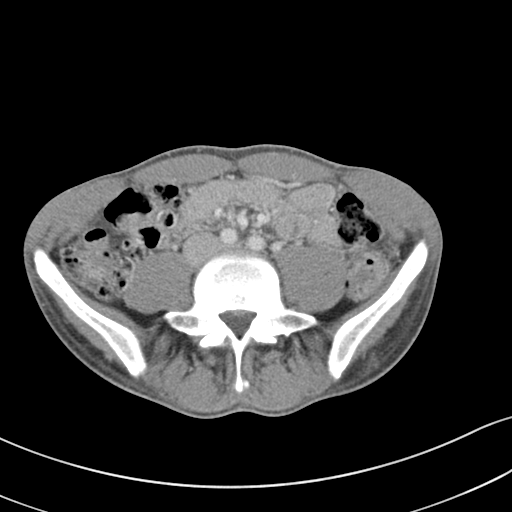
[im 53/89  soft-tissue]
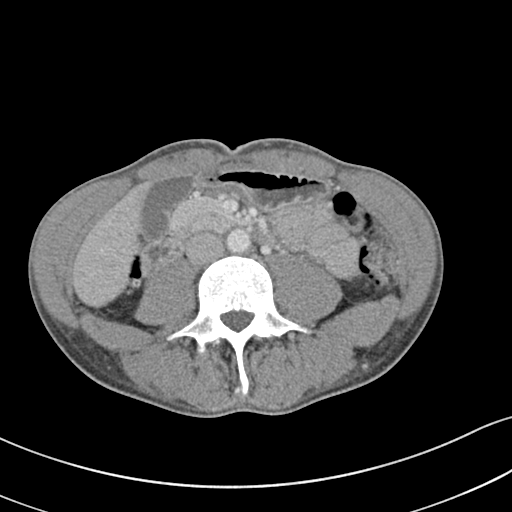
[im 59/89  soft-tissue]
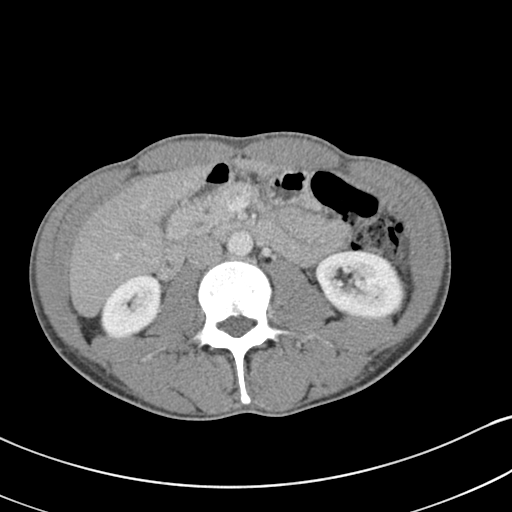
[im 59/89  bone]
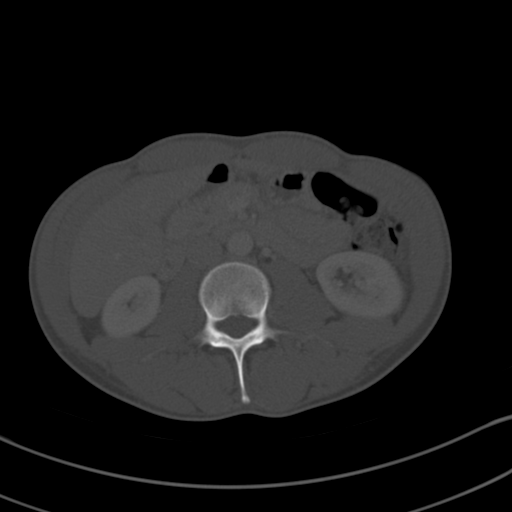
[im 65/89  soft-tissue]
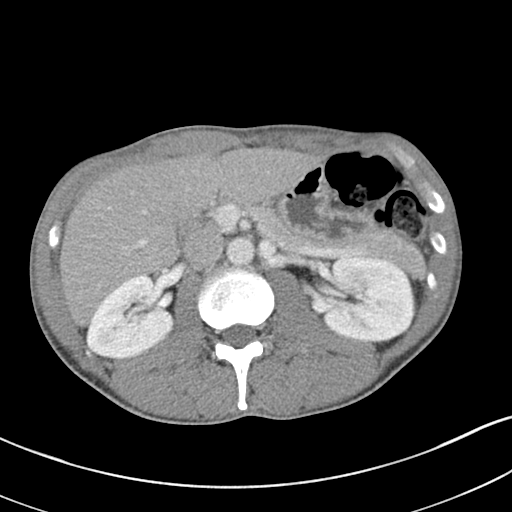
[im 71/89  soft-tissue]
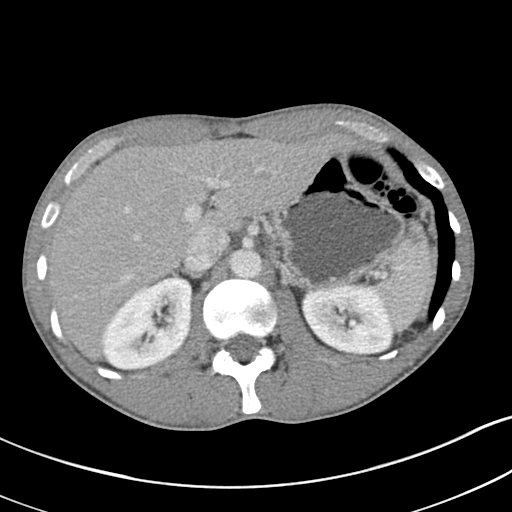
[im 77/89  soft-tissue]
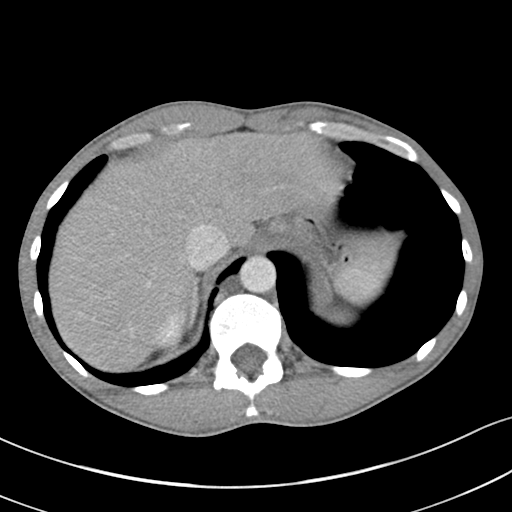
[im 83/89  soft-tissue]
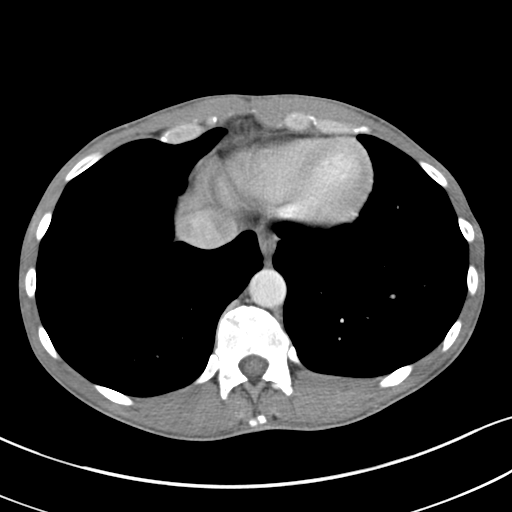

[Series 5: coronal st · coronal · 0.64mm/px · 3 of 119 slices shown]
[im 40/119  soft-tissue]
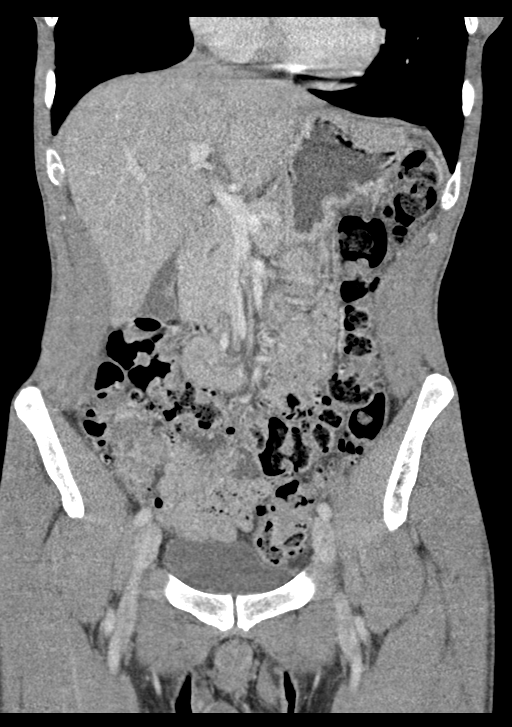
[im 53/119  soft-tissue]
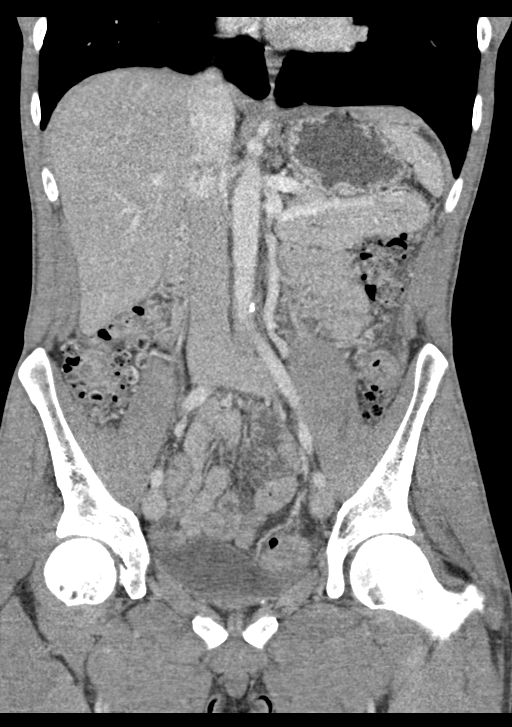
[im 66/119  soft-tissue]
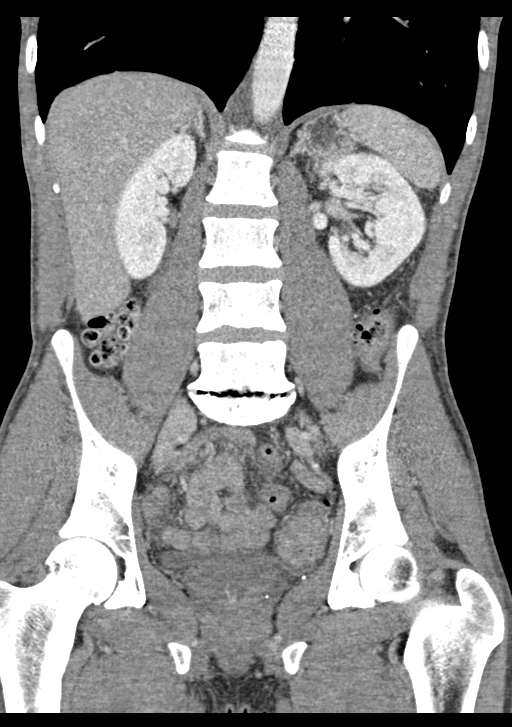

[16 of 46 positions shown; findings below may reference images not displayed]

FINDINGS: Lower chest: No acute abnormality.

Hepatobiliary: No focal liver abnormality is seen. No gallstones,
gallbladder wall thickening, or biliary dilatation.

Pancreas: Unremarkable. No pancreatic ductal dilatation or
surrounding inflammatory changes.

Spleen: Normal in size without focal abnormality.

Adrenals/Urinary Tract: Adrenal glands are unremarkable. Kidneys are
normal, without renal calculi, focal lesion, or hydronephrosis.
Bladder is unremarkable.

Stomach/Bowel: Stomach is within normal limits. Appendix appears
normal. No evidence of bowel dilatation. Diverticula are seen
throughout the large bowel. A segment of moderately thickened and
inflamed mid sigmoid colon is noted. There is no evidence of
associated perforation or abscess.

Vascular/Lymphatic: There is mild calcification of the abdominal
aorta. No enlarged abdominal or pelvic lymph nodes.

Reproductive: Prostate is unremarkable.

Other: No abdominal wall hernia or abnormality. No abdominopelvic
ascites.

Musculoskeletal: No acute or significant osseous findings.
IMPRESSION: 1. Moderate severity sigmoid colitis and/or diverticulitis.
2. Aortic atherosclerosis.

Aortic Atherosclerosis ([id number]).
